# Patient Record
Sex: Female | Born: 1970 | Race: White | Hispanic: No | Marital: Married | Smoking: Former smoker
Health system: Southern US, Community
[De-identification: ages and names within clinical notes are randomized; demographics above are authoritative.]

## PROBLEM LIST (undated history)

## (undated) DIAGNOSIS — R32 Unspecified urinary incontinence: Secondary | ICD-10-CM

## (undated) DIAGNOSIS — D069 Carcinoma in situ of cervix, unspecified: Secondary | ICD-10-CM

## (undated) DIAGNOSIS — R7611 Nonspecific reaction to tuberculin skin test without active tuberculosis: Secondary | ICD-10-CM

## (undated) DIAGNOSIS — Z87440 Personal history of urinary (tract) infections: Secondary | ICD-10-CM

## (undated) DIAGNOSIS — G43909 Migraine, unspecified, not intractable, without status migrainosus: Secondary | ICD-10-CM

## (undated) DIAGNOSIS — Z8619 Personal history of other infectious and parasitic diseases: Secondary | ICD-10-CM

## (undated) DIAGNOSIS — Z789 Other specified health status: Secondary | ICD-10-CM

## (undated) DIAGNOSIS — Z8489 Family history of other specified conditions: Secondary | ICD-10-CM

## (undated) DIAGNOSIS — T7840XA Allergy, unspecified, initial encounter: Secondary | ICD-10-CM

## (undated) DIAGNOSIS — B977 Papillomavirus as the cause of diseases classified elsewhere: Secondary | ICD-10-CM

## (undated) HISTORY — PX: KNEE SURGERY: SHX244

## (undated) HISTORY — DX: Personal history of urinary (tract) infections: Z87.440

## (undated) HISTORY — DX: Personal history of other infectious and parasitic diseases: Z86.19

## (undated) HISTORY — DX: Migraine, unspecified, not intractable, without status migrainosus: G43.909

## (undated) HISTORY — DX: Carcinoma in situ of cervix, unspecified: D06.9

## (undated) HISTORY — PX: CATARACT EXTRACTION: SUR2

## (undated) HISTORY — DX: Papillomavirus as the cause of diseases classified elsewhere: B97.7

## (undated) HISTORY — PX: LEEP: SHX91

## (undated) HISTORY — PX: TONSILLECTOMY: SUR1361

## (undated) HISTORY — DX: Allergy, unspecified, initial encounter: T78.40XA

## (undated) HISTORY — DX: Nonspecific reaction to tuberculin skin test without active tuberculosis: R76.11

## (undated) HISTORY — DX: Unspecified urinary incontinence: R32

---

## 2008-03-09 ENCOUNTER — Inpatient Hospital Stay (HOSPITAL_COMMUNITY): Admission: AD | Admit: 2008-03-09 | Discharge: 2008-03-09 | Payer: Self-pay | Admitting: Obstetrics & Gynecology

## 2008-03-10 ENCOUNTER — Inpatient Hospital Stay (HOSPITAL_COMMUNITY): Admission: AD | Admit: 2008-03-10 | Discharge: 2008-03-14 | Payer: Self-pay | Admitting: Obstetrics and Gynecology

## 2008-05-14 ENCOUNTER — Emergency Department (HOSPITAL_BASED_OUTPATIENT_CLINIC_OR_DEPARTMENT_OTHER): Admission: EM | Admit: 2008-05-14 | Discharge: 2008-05-15 | Payer: Self-pay | Admitting: Emergency Medicine

## 2010-01-14 ENCOUNTER — Inpatient Hospital Stay (HOSPITAL_COMMUNITY): Admission: AD | Admit: 2010-01-14 | Discharge: 2010-01-14 | Payer: Self-pay | Admitting: Obstetrics and Gynecology

## 2010-03-02 ENCOUNTER — Inpatient Hospital Stay (HOSPITAL_COMMUNITY)
Admission: AD | Admit: 2010-03-02 | Discharge: 2010-03-04 | Payer: Self-pay | Source: Home / Self Care | Attending: Obstetrics | Admitting: Obstetrics

## 2010-03-03 ENCOUNTER — Encounter (INDEPENDENT_AMBULATORY_CARE_PROVIDER_SITE_OTHER): Payer: Self-pay | Admitting: Obstetrics

## 2010-06-05 LAB — CBC
HCT: 30.1 % — ABNORMAL LOW (ref 36.0–46.0)
Hemoglobin: 10.4 g/dL — ABNORMAL LOW (ref 12.0–15.0)
MCH: 34.4 pg — ABNORMAL HIGH (ref 26.0–34.0)
MCHC: 34.6 g/dL (ref 30.0–36.0)
MCHC: 34.8 g/dL (ref 30.0–36.0)
MCV: 100.7 fL — ABNORMAL HIGH (ref 78.0–100.0)
Platelets: 223 10*3/uL (ref 150–400)
RDW: 15.1 % (ref 11.5–15.5)
WBC: 18.7 10*3/uL — ABNORMAL HIGH (ref 4.0–10.5)

## 2010-06-05 LAB — RPR: RPR Ser Ql: NONREACTIVE

## 2010-06-06 LAB — URINALYSIS, ROUTINE W REFLEX MICROSCOPIC
Bilirubin Urine: NEGATIVE
Glucose, UA: NEGATIVE mg/dL
Hgb urine dipstick: NEGATIVE
Specific Gravity, Urine: >1.03 — ABNORMAL HIGH (ref 1.005–1.030)
Urobilinogen, UA: 0.2 mg/dL (ref 0.0–1.0)

## 2010-07-10 LAB — URINALYSIS, ROUTINE W REFLEX MICROSCOPIC
Glucose, UA: NEGATIVE mg/dL
Protein, ur: 100 mg/dL — AB
pH: 6 (ref 5.0–8.0)

## 2010-07-10 LAB — URINE MICROSCOPIC-ADD ON

## 2010-08-07 NOTE — Op Note (Signed)
NAMEMAREN, Judy Wilson               ACCOUNT NO.:  [id number]   MEDICAL RECORD NO.:  [id number]          PATIENT TYPE:  INP   LOCATION:  9112                          FACILITY:  [facility]   PHYSICIAN:  Genia Del, M.D.DATE OF BIRTH:  [birth year]   DATE OF PROCEDURE:  03/10/2008  DATE OF DISCHARGE:                               OPERATIVE REPORT   PREOPERATIVE DIAGNOSES:  18 weeks' gestation, arrest of progression in  labor.   POSTOPERATIVE DIAGNOSES:  38 weeks' gestation, arrest of progression in  labor.   PROCEDURE:  Urgent primary low-transverse C-section.   SURGEON:  Genia Del, MD   ASSISTANT:  Marlinda Mike, CNM   ANESTHESIOLOGIST:  Quillian Quince, MD   PROCEDURE:  Under epidural anesthesia, the patient was in 15-degrees  left decubitus position.  She was prepped with Betadine on the  abdominal, suprapubic, vulvar, and vaginal areas.  The bladder was  catheterized and the Foley was left in place, and the patient was draped  as usual.  The level of anesthesia was verified.  We infiltrated the  subcutaneous tissue with Marcaine one-quarter plain 10 mL at the site  where the Pfannenstiel was then done with a scalpel.  We opened the  adipose tissue with the electrocautery cutting mode, complete hemostasis  with the electrocautery cautery when necessary.  We opened the  aponeurosis transversely with the electrocautery and completed with Mayo  scissors.  We then separated the recti muscles from the aponeurosis on  the midline superiorly and inferiorly.  We opened the parietal  peritoneum longitudinally with [facility] scissors.  We then put the bladder  retractor in place.  We opened the visceral peritoneum over the lower  uterine segment transversely with Jen Mow scissors.  We reclined the  bladder downward, repositioned the bladder retractor.  We made a low-  transverse hysterotomy over the lower uterine segment with the scalpel.  We extended on each side with dressing scissors.   The fetus was in  cephalic presentation, right posterior occiput.  The amniotic fluid was  clear.  Birth of a baby girl at 10:38 p.m.  The cord was clamped and  cut, the baby was suctioned and given to the neonatal team.  Apgars were  9 and 9.  The weight was 6 pounds 3 ounces.  The placenta was evacuated  manually and sent to Labor and Delivery.  The patient is a cord blood  donor.  We did a uterine revision.  We put 40 units of Pitocin in 1 L to  help the uterine contraction.  The uterus contracts well with that.  We  then closed the hysterotomy with a first locked suture of Vicryl 0 full  plain at the level of the extension on the lower right aspect of the  incision.  We then closed the incision itself with a locked running  suture of Vicryl 0 and we made a second layer in a mattress fashion with  Vicryl 0.  Hemostasis was adequate at all levels.  The uterus was well  contracted and normal in appearance and size.  Both ovaries and both  tubes were normal in size and appearance.  We then irrigated and  suctioned the abdominopelvic cavities.  We completed hemostasis  necessary on the recti muscles and anteriorly around the bladder.  We  then closed the aponeurosis with 2 half running sutures of Vicryl 0,  complete hemostasis on the adipose tissue with the  electrocautery, reapproximated the skin with staples and a dry dressing  was applied.  The count of sponges and instrument was complete x2.  The  estimated blood loss was 500 mL.  No complications occurred, and the  patient was brought to recovery room in good stable status.      Genia Del, M.D.  Electronically Signed     ML/MEDQ  D:  03/10/2008  T:  03/11/2008  Job:  [id number]

## 2010-08-07 NOTE — Discharge Summary (Signed)
NAMEGLORIAN, MCDONELL               ACCOUNT NO.:  [id number]   MEDICAL RECORD NO.:  [id number]          PATIENT TYPE:  INP   LOCATION:  9112                          FACILITY:  [facility]   PHYSICIAN:  Genia Del, M.D.DATE OF BIRTH:  04-27-1970   DATE OF ADMISSION:  03/10/2008  DATE OF DISCHARGE:  03/14/2008                               DISCHARGE SUMMARY   ADMISSION DIAGNOSIS:  38 weeks spontaneous labor with spontaneous  rupture of membranes.   DISCHARGE DIAGNOSES:  1. 38 weeks spontaneous labor with spontaneous rupture of membranes.  2. Arrest of progression of dilatation.   PROCEDURE:  Urgent primary low transverse C-section.   HOSPITAL COURSE:  The patient was admitted in labor with spontaneous  rupture of membranes.  She progressed to 6 cm and then did not  progressed further, in spite of augmenting labor with Pitocin.  The  decision was taken to proceed with an urgent primary low transverse C-  section.  She had a baby girl.  Apgars were 9 and 9.  The estimated  blood loss was 500 mL.  No complications occurred.  The postop  evaluation was normal except for severe independent edema.  The patient  was given hydrochlorothiazide and was discharged home on postop day #4  in good stable status.  Postop hemoglobin was 9.7.  She was given postop  advice and will follow up at [facility] office in 6 weeks.      Genia Del, M.D.  Electronically Signed     ML/MEDQ  D:  05/05/2008  T:  05/06/2008  Job:  [id number]

## 2010-12-28 LAB — CCBB MATERNAL DONOR DRAW

## 2010-12-28 LAB — CBC
HCT: 34.5 % — ABNORMAL LOW (ref 36.0–46.0)
Hemoglobin: 9.7 g/dL — ABNORMAL LOW (ref 12.0–15.0)
Platelets: 231 10*3/uL (ref 150–400)
RBC: 2.81 MIL/uL — ABNORMAL LOW (ref 3.87–5.11)
RDW: 14.4 % (ref 11.5–15.5)
WBC: 16.9 10*3/uL — ABNORMAL HIGH (ref 4.0–10.5)
WBC: 19.8 10*3/uL — ABNORMAL HIGH (ref 4.0–10.5)

## 2010-12-28 LAB — RPR: RPR Ser Ql: NONREACTIVE

## 2011-02-03 ENCOUNTER — Inpatient Hospital Stay (HOSPITAL_COMMUNITY)
Admission: AD | Admit: 2011-02-03 | Discharge: 2011-02-03 | Disposition: A | Discharge status: Home / Self Care | Payer: 59 | Source: Ambulatory Visit | Attending: Obstetrics & Gynecology | Admitting: Obstetrics & Gynecology

## 2011-02-03 ENCOUNTER — Encounter (HOSPITAL_COMMUNITY): Payer: Self-pay

## 2011-02-03 DIAGNOSIS — O200 Threatened abortion: Secondary | ICD-10-CM

## 2011-02-03 HISTORY — DX: Other specified health status: Z78.9

## 2011-02-03 LAB — HCG, QUANTITATIVE, PREGNANCY: hCG, Beta Chain, Quant, S: 48 m[IU]/mL — ABNORMAL HIGH (ref <5)

## 2011-02-03 NOTE — Progress Notes (Signed)
Positive home test and blood test on 01/22/11, LMP 12/23/10 onset of vaginal bleeding x 1 hour like a period lighter now.

## 2011-02-03 NOTE — ED Provider Notes (Signed)
History   Judy Wilson is a 40 y.o. female [phone] with LMP 12/23/2010 and (+) SPT last week in office. Today presents to MAU after short episode of dark brown spotting. No active vaginal bleed at this time. Denies pelvic/abdominal pain, no N/V. She is smoker 1 pack/day. Previous term C/S, SAB, and VBAC. No hx ectopic. Blood type O(+) per office records.   Chief Complaint  Patient presents with  . Vaginal Bleeding  . Back Pain   HPI  OB History    Grav Para Term Preterm Abortions TAB SAB Ect Mult Living   4 2 2  0 1 0 1 0 0 2      Past Medical History  Diagnosis Date  . No pertinent past medical history     Past Surgical History  Procedure Date  . Cesarean section   . Knee surgery     No family history on file.  History  Substance Use Topics  . Smoking status: Current Everyday Smoker -- 1.0 packs/day  . Smokeless tobacco: Not on file  . Alcohol Use: No    Allergies:  Allergies  Allergen Reactions  . Penicillins Anaphylaxis and Rash  . Sulfa Antibiotics Anaphylaxis    Prescriptions prior to admission  Medication Sig Dispense Refill  . acetaminophen (TYLENOL) 325 MG tablet Take 650 mg by mouth every 6 (six) hours as needed. For pain       . prenatal vitamin w/FE, FA (PRENATAL 1 + 1) 27-1 MG TABS Take 1 tablet by mouth daily.          Review of Systems  Constitutional: Negative for fever and chills.  Gastrointestinal: Negative for nausea, vomiting and abdominal pain.  Musculoskeletal: Positive for back pain.  Neurological: Negative for headaches.   Physical Exam   Blood pressure 133/81, pulse 87, temperature 98.1 F (36.7 C), temperature source Oral, resp. rate 18, height 5' 4.5" (1.638 m), weight 54.432 kg (120 lb), last menstrual period 12/23/2010.  Physical Exam  Constitutional: She is oriented to person, place, and time. She appears well-developed and well-nourished. She appears distressed (appropriately teary).  HENT:  Head: Normocephalic and  atraumatic.  Neck: Normal range of motion.  Cardiovascular: Normal rate and regular rhythm.   Respiratory: Effort normal and breath sounds normal.  GI: Soft. Bowel sounds are normal. She exhibits no distension. There is no tenderness. There is no rebound.  Genitourinary:       deferred  Musculoskeletal: Normal range of motion.  Neurological: She is alert and oriented to person, place, and time.  Skin: Skin is warm and dry.  Psychiatric: She has a normal mood and affect.    MAU Course  Procedures Results for orders placed during the hospital encounter of 02/03/11 (from the past 24 hour(s))  HCG, QUANTITATIVE, PREGNANCY     Status: Abnormal   Collection Time   02/03/11  2:10 PM      Component Value Range   hCG, Beta Chain, Quant, S 48 (*) <5 (mIU/mL)   MDM   Assessment and Plan  Threatened AB with inadequate quant at [redacted]wks EGA Discussed with patient likely SAB with abnormal low quant; SAB likely starting, quant too low to visualize pregnancy via [place].  Precautions given, support given, patient appropriately upset, spouse supportive at [facility].  F/U with serial quant until <5, lab visit in office next week.   Deloyd Handy 02/03/2011, 3:51 PM

## 2011-02-03 NOTE — Progress Notes (Signed)
Pt presents to MAU with chief complaint of lower back pain and vaginal bleeding in early pregnancy  6 weeks. Pt states the lower back pain started this morning around 9:00 and vaginal bleeding started 1300. Pt is seen at [facility] and is G4P2

## 2011-02-03 NOTE — Progress Notes (Signed)
Colon Flattery notified of patient arrival to MAU with complaints vaginal bleeding and back pain. Orders received to draw Quant and will notify D.Renae Fickle CNM of results.

## 2012-05-19 ENCOUNTER — Other Ambulatory Visit: Payer: Self-pay | Admitting: Family Medicine

## 2012-05-19 ENCOUNTER — Other Ambulatory Visit (HOSPITAL_COMMUNITY)
Admission: RE | Admit: 2012-05-19 | Discharge: 2012-05-19 | Disposition: A | Discharge status: Home / Self Care | Payer: Medicaid Other | Source: Ambulatory Visit | Attending: Family Medicine | Admitting: Family Medicine

## 2012-05-19 DIAGNOSIS — Z124 Encounter for screening for malignant neoplasm of cervix: Secondary | ICD-10-CM | POA: Insufficient documentation

## 2013-08-19 ENCOUNTER — Other Ambulatory Visit: Payer: Self-pay | Admitting: Family Medicine

## 2013-08-19 ENCOUNTER — Other Ambulatory Visit (HOSPITAL_COMMUNITY)
Admission: RE | Admit: 2013-08-19 | Discharge: 2013-08-19 | Disposition: A | Discharge status: Home / Self Care | Payer: Medicaid Other | Source: Ambulatory Visit | Attending: Family Medicine | Admitting: Family Medicine

## 2013-08-19 DIAGNOSIS — Z1151 Encounter for screening for human papillomavirus (HPV): Secondary | ICD-10-CM | POA: Insufficient documentation

## 2013-08-19 DIAGNOSIS — Z124 Encounter for screening for malignant neoplasm of cervix: Secondary | ICD-10-CM | POA: Insufficient documentation

## 2013-10-12 ENCOUNTER — Encounter: Payer: Self-pay | Admitting: Interventional Cardiology

## 2013-10-12 ENCOUNTER — Ambulatory Visit (INDEPENDENT_AMBULATORY_CARE_PROVIDER_SITE_OTHER): Payer: Medicaid Other | Admitting: Interventional Cardiology

## 2013-10-12 VITALS — BP 104/64 | HR 82 | Ht 64.5 in | Wt 136.0 lb

## 2013-10-12 DIAGNOSIS — I498 Other specified cardiac arrhythmias: Secondary | ICD-10-CM

## 2013-10-12 DIAGNOSIS — I471 Supraventricular tachycardia: Secondary | ICD-10-CM | POA: Insufficient documentation

## 2013-10-12 NOTE — Progress Notes (Signed)
Patient ID: Judy RumbleKarmen A Wilson, female   DOB: 03/03/1971, 43 y.o.   MRN: [id number]   Date: 10/12/2013 ID: Judy RumbleKarmen A Barrack, DOB 12/22/1970, MRN [id number] PCP: Shirlean MylarWEBB, CAROL, D, MD  Reason: Tachycardia  ASSESSMENT;  1. Tachycardia, abrupt and resolution, likely PSVT  PLAN:  1. 30 day event monitor 2. 2-D Doppler echocardiogram 3. Clinical followup in 5-6 weeks   SUBJECTIVE: Judy Wilson is a 43 y.o. female who is  Here for evaluation of tachycardia that occurred in mid July. 3 distinct episodes. Worse of which occurred at the very onset of this complaint. It lasted approximately an hour with heart rates near 160 beats per minute. She refuses to go to the emergency room after being seen by the nurse practitioner in a [facility] in [place]. The associated symptoms or pallor, neck discomfort, chest heaviness, position of the heart was racing, and dyspnea. She subsequently had 2 additional episodes each of which lasted less than 10 minutes. They will separate days. She had 2 additional walk-in clinic visits. EKG is at walk-in clinics were all normal. She did not have an EKG during the tachycardia as [facility]. Her blood pressure was also documented along with the heart rate was above 120/70.  Additionally she has been having tenderness in the left parasternal area. Her left arm is been tingly. She has a shocklike sensation in the neck. There is stiffness in her neck. She has no history of cervical disc disease. She also feels that there is some swelling of the left hand.   Allergies  Allergen Reactions  . Penicillins Anaphylaxis and Rash  . Sulfa Antibiotics Anaphylaxis    No current outpatient prescriptions on file prior to visit.   No current facility-administered medications on file prior to visit.    Past Medical History  Diagnosis Date  . No pertinent past medical history     Past Surgical History  Procedure Laterality Date  . Cesarean section    . Knee surgery      History   Social  History  . Marital Status: Married    Spouse Name: N/A    Number of Children: N/A  . Years of Education: N/A   Occupational History  . Not on file.   Social History Main Topics  . Smoking status: Former Smoker -- 1.00 packs/day    Quit date: 03/25/2012  . Smokeless tobacco: Not on file  . Alcohol Use: No  . Drug Use: No  . Sexual Activity: Not on file   Other Topics Concern  . Not on file   Social History Narrative  . No narrative on file    Family History  Problem Relation Age of Onset  . Anemia Maternal Grandmother     ROS: No episodes of syncope. Tingling and numbness in the left arm and hand. Stiffness and soreness in the left side of the neck. Left parasternal tenderness.. Other systems negative for complaints.  OBJECTIVE: BP 104/64  Pulse 82  Ht 5' 4.5" (1.638 m)  Wt 136 lb (61.689 kg)  BMI 22.99 kg/m2,  General: No acute distress, healthy HEENT: normal no pallor or jaundice Neck: JVD flat. Carotids absent Chest: Clear Cardiac: Murmur: None. Gallop: Normal. Rhythm: Normal. Other: None Abdomen: Bruit: Absent. Pulsation: Absent Extremities: Edema: Absent. Pulses: 2+ Neuro: Normal Psych: Normal  ECG: ECG performed on 10/09/13 is normal. This [facility] was [facility]

## 2013-10-12 NOTE — Patient Instructions (Signed)
Your physician has recommended that you wear an event monitor. Event monitors are medical devices that record the heart's electrical activity. Doctors most often us these monitors to diagnose arrhythmias. Arrhythmias are problems with the speed or rhythm of the heartbeat. The monitor is a small, portable device. You can wear one while you do your normal daily activities. This is usually used to diagnose what is causing palpitations/syncope (passing out).  Your physician has requested that you have an echocardiogram. Echocardiography is a painless test that uses sound waves to create images of your heart. It provides your doctor with information about the size and shape of your heart and how well your heart's chambers and valves are working. This procedure takes approximately one hour. There are no restrictions for this procedure.  Your physician recommends that you schedule a follow-up appointment in: 6 WEEKS WITH DR. Katrinka BlazingSMITH.

## 2013-10-13 ENCOUNTER — Encounter (INDEPENDENT_AMBULATORY_CARE_PROVIDER_SITE_OTHER): Payer: Medicaid Other

## 2013-10-13 ENCOUNTER — Encounter: Payer: Self-pay | Admitting: *Deleted

## 2013-10-13 DIAGNOSIS — I471 Supraventricular tachycardia: Secondary | ICD-10-CM

## 2013-10-13 NOTE — Progress Notes (Signed)
Patient ID: Judy RumbleKarmen A Wilson, female   DOB: 05/21/1970, 43 y.o.   MRN: [id number] [employer] 30 day cardiac event monitor applied to patient.

## 2013-10-25 ENCOUNTER — Ambulatory Visit (HOSPITAL_COMMUNITY): Payer: Medicaid Other | Attending: Cardiology | Admitting: Cardiology

## 2013-10-25 DIAGNOSIS — I471 Supraventricular tachycardia, unspecified: Secondary | ICD-10-CM | POA: Insufficient documentation

## 2013-10-25 DIAGNOSIS — R0609 Other forms of dyspnea: Secondary | ICD-10-CM | POA: Diagnosis not present

## 2013-10-25 DIAGNOSIS — Z87891 Personal history of nicotine dependence: Secondary | ICD-10-CM | POA: Diagnosis not present

## 2013-10-25 DIAGNOSIS — R0989 Other specified symptoms and signs involving the circulatory and respiratory systems: Secondary | ICD-10-CM | POA: Insufficient documentation

## 2013-10-25 NOTE — Progress Notes (Signed)
Echo performed. 

## 2013-10-29 ENCOUNTER — Telehealth: Payer: Self-pay

## 2013-10-29 NOTE — Telephone Encounter (Signed)
pt given echo results.Echo is normal.pt verbalized understanding.

## 2013-10-29 NOTE — Telephone Encounter (Signed)
Message copied by Jarvis NewcomerPARRIS-GODLEY, LISA S on Fri Oct 29, 2013  2:05 PM ------      Message from: Verdis PrimeSMITH, HENRY      Created: Tue Oct 26, 2013  7:12 PM       Echo is normal. ------

## 2013-11-22 ENCOUNTER — Ambulatory Visit: Payer: Medicaid Other | Admitting: Interventional Cardiology

## 2013-12-08 ENCOUNTER — Telehealth: Payer: Self-pay

## 2013-12-08 NOTE — Telephone Encounter (Signed)
pt husbands aware of cardiac monitor results -Normal pt husband verbalized understanding.

## 2013-12-14 ENCOUNTER — Ambulatory Visit: Payer: Self-pay | Admitting: Interventional Cardiology

## 2014-01-24 ENCOUNTER — Encounter: Payer: Self-pay | Admitting: Interventional Cardiology

## 2017-12-22 ENCOUNTER — Encounter (HOSPITAL_COMMUNITY): Payer: Self-pay | Admitting: Urology

## 2017-12-22 ENCOUNTER — Ambulatory Visit: Payer: Self-pay | Admitting: General Surgery

## 2017-12-22 NOTE — H&P (Signed)
  History of Present Illness (Yarel Kilcrease MD; 12/22/2017 10:46 AM) The patient is a 47 year old female who presents for evaluation of gall stones. Referred by: Dr. Bujanowski Chief Complaint: Abdominal pain  Patient is a 47-year-old female, otherwise healthy comes in with a year and a half history of abdominal pain that was in the epigastrium. Patient states that this was but however has become more frequent recently. Secondary to continued pain, nausea and vomiting patient underwent CT scan ultrasound. His did reveal signs consistent with cholelithiasis, 2 large gallstones, as well as possible thickening of the gallbladder wall. Patient was given levofloxacin and Flagyl over the last week. She states that she did have some hives recently stopped that at the 6. Patient states that she has been on a bland diet over the last 3 days.  Patient's had no previous abdominal surgeries aside from a C-section.    Allergies (Tanisha A. Brown, RMA; 12/22/2017 10:34 AM) Penicillins  Sulfa Antibiotics  Allergies Reconciled   Medication History (Tanisha A. Brown, RMA; 12/22/2017 10:35 AM) Ondansetron (4MG Tablet, Oral) Active. levoFLOXacin (500MG Tablet, Oral) Active. methazolAMIDE (50MG Tablet, Oral) Active. Medications Reconciled    Review of Systems (Davionna Blacksher, MD; 12/22/2017 10:47 AM) General Present- Feeling well. Not Present- Fever. Respiratory Not Present- Cough and Difficulty Breathing. Cardiovascular Not Present- Chest Pain. Gastrointestinal Present- Abdominal Pain and Nausea. Musculoskeletal Not Present- Myalgia. Neurological Not Present- Weakness. All other systems negative  Vitals (Tanisha A. Brown RMA; 12/22/2017 10:33 AM) 12/22/2017 10:33 AM Weight: 146.6 lb Height: 65in Body Surface Area: 1.73 m Body Mass Index: 24.4 kg/m  Temp.: 97.8F  Pulse: 102 (Regular)  BP: 128/84 (Sitting, Left Arm, Standard)       Physical Exam (Alin Hutchins MD;  12/22/2017 10:47 AM) The physical exam findings are as follows: Note:Constitutional: No acute distress, conversant, appears stated age  Eyes: Anicteric sclerae, moist conjunctiva, no lid lag  Neck: No thyromegaly, trachea midline, no cervical lymphadenopathy  Lungs: Clear to auscultation biilaterally, normal respiratory effot  Cardiovascular: regular rate & rhythm, no murmurs, no peripheal edema, pedal pulses 2+  GI: Soft, no masses or hepatosplenomegaly, non-tender to palpation  MSK: Normal gait, no clubbing cyanosis, edema  Skin: No rashes, palpation reveals normal skin turgor  Psychiatric: Appropriate judgment and insight, oriented to person, place, and time    Assessment & Plan (Shaquasia Caponigro MD; 12/22/2017 10:47 AM) SYMPTOMATIC CHOLELITHIASIS (K80.20) Impression: 47-year-old female with symptomatic cholelithiasis, chronic cholecystitis  1. We will proceed to the operating room for a laparoscopic cholecystectomy  2. Risks and benefits were discussed with the patient to generally include, but not limited to: infection, bleeding, possible need for post op ERCP, damage to the bile ducts, bile leak, and possible need for further surgery. Alternatives were offered and described. All questions were answered and the patient voiced understanding of the procedure and wishes to proceed at this point with a laparoscopic cholecystectomy 

## 2017-12-22 NOTE — Progress Notes (Signed)
Denies chest pain, shob, or cardiology visit.  

## 2017-12-22 NOTE — H&P (View-Only) (Signed)
  History of Present Illness Axel Filler MD; 12/22/2017 10:46 AM) The patient is a 47 year old female who presents for evaluation of gall stones. Referred by: Dr. Minda Ditto Chief Complaint: Abdominal pain  Patient is a 47 year old female, otherwise healthy comes in with a year and a half history of abdominal pain that was in the epigastrium. Patient states that this was but however has become more frequent recently. Secondary to continued pain, nausea and vomiting patient underwent CT scan ultrasound. His did reveal signs consistent with cholelithiasis, 2 large gallstones, as well as possible thickening of the gallbladder wall. Patient was given levofloxacin and Flagyl over the last week. She states that she did have some hives recently stopped that at the 6. Patient states that she has been on a bland diet over the last 3 days.  Patient's had no previous abdominal surgeries aside from a C-section.    Allergies (Tanisha A. Manson Passey, RMA; 12/22/2017 10:34 AM) Penicillins  Sulfa Antibiotics  Allergies Reconciled   Medication History (Tanisha A. Manson Passey, RMA; 12/22/2017 10:35 AM) Ondansetron (4MG  Tablet, Oral) Active. levoFLOXacin (500MG  Tablet, Oral) Active. methazolAMIDE (50MG  Tablet, Oral) Active. Medications Reconciled    Review of Systems Axel Filler, MD; 12/22/2017 10:47 AM) General Present- Feeling well. Not Present- Fever. Respiratory Not Present- Cough and Difficulty Breathing. Cardiovascular Not Present- Chest Pain. Gastrointestinal Present- Abdominal Pain and Nausea. Musculoskeletal Not Present- Myalgia. Neurological Not Present- Weakness. All other systems negative  Vitals (Tanisha A. Brown RMA; 12/22/2017 10:33 AM) 12/22/2017 10:33 AM Weight: 146.6 lb Height: 65in Body Surface Area: 1.73 m Body Mass Index: 24.4 kg/m  Temp.: 97.52F  Pulse: 102 (Regular)  BP: 128/84 (Sitting, Left Arm, Standard)       Physical Exam Axel Filler MD;  12/22/2017 10:47 AM) The physical exam findings are as follows: Note:Constitutional: No acute distress, conversant, appears stated age  Eyes: Anicteric sclerae, moist conjunctiva, no lid lag  Neck: No thyromegaly, trachea midline, no cervical lymphadenopathy  Lungs: Clear to auscultation biilaterally, normal respiratory effot  Cardiovascular: regular rate & rhythm, no murmurs, no peripheal edema, pedal pulses 2+  GI: Soft, no masses or hepatosplenomegaly, non-tender to palpation  MSK: Normal gait, no clubbing cyanosis, edema  Skin: No rashes, palpation reveals normal skin turgor  Psychiatric: Appropriate judgment and insight, oriented to person, place, and time    Assessment & Plan Axel Filler MD; 12/22/2017 10:47 AM) SYMPTOMATIC CHOLELITHIASIS (K80.20) Impression: 47 year old female with symptomatic cholelithiasis, chronic cholecystitis  1. We will proceed to the operating room for a laparoscopic cholecystectomy  2. Risks and benefits were discussed with the patient to generally include, but not limited to: infection, bleeding, possible need for post op ERCP, damage to the bile ducts, bile leak, and possible need for further surgery. Alternatives were offered and described. All questions were answered and the patient voiced understanding of the procedure and wishes to proceed at this point with a laparoscopic cholecystectomy

## 2017-12-23 ENCOUNTER — Ambulatory Visit (HOSPITAL_COMMUNITY): Payer: BLUE CROSS/BLUE SHIELD | Admitting: Certified Registered Nurse Anesthetist

## 2017-12-23 ENCOUNTER — Encounter (HOSPITAL_COMMUNITY): Payer: Self-pay | Admitting: Surgery

## 2017-12-23 ENCOUNTER — Ambulatory Visit (HOSPITAL_COMMUNITY)
Admission: RE | Admit: 2017-12-23 | Discharge: 2017-12-23 | Disposition: A | Discharge status: Home / Self Care | Payer: BLUE CROSS/BLUE SHIELD | Source: Ambulatory Visit | Attending: General Surgery | Admitting: General Surgery

## 2017-12-23 ENCOUNTER — Other Ambulatory Visit: Payer: Self-pay

## 2017-12-23 ENCOUNTER — Encounter (HOSPITAL_COMMUNITY)
Admission: RE | Disposition: A | Discharge status: Home / Self Care | Payer: Self-pay | Source: Ambulatory Visit | Attending: General Surgery

## 2017-12-23 DIAGNOSIS — K8080 Other cholelithiasis without obstruction: Secondary | ICD-10-CM | POA: Diagnosis present

## 2017-12-23 DIAGNOSIS — K8010 Calculus of gallbladder with chronic cholecystitis without obstruction: Secondary | ICD-10-CM | POA: Insufficient documentation

## 2017-12-23 DIAGNOSIS — Z791 Long term (current) use of non-steroidal anti-inflammatories (NSAID): Secondary | ICD-10-CM | POA: Insufficient documentation

## 2017-12-23 DIAGNOSIS — Z87891 Personal history of nicotine dependence: Secondary | ICD-10-CM | POA: Insufficient documentation

## 2017-12-23 HISTORY — PX: CHOLECYSTECTOMY: SHX55

## 2017-12-23 HISTORY — DX: Family history of other specified conditions: Z84.89

## 2017-12-23 LAB — POCT PREGNANCY, URINE: Preg Test, Ur: NEGATIVE

## 2017-12-23 LAB — HEMOGLOBIN: HEMOGLOBIN: 13.9 g/dL (ref 12.0–15.0)

## 2017-12-23 SURGERY — LAPAROSCOPIC CHOLECYSTECTOMY
Anesthesia: General

## 2017-12-23 MED ORDER — 0.9 % SODIUM CHLORIDE (POUR BTL) OPTIME
TOPICAL | Status: DC | PRN
  Administered 2017-12-23: 1000 mL

## 2017-12-23 MED ORDER — MIDAZOLAM HCL 5 MG/5ML IJ SOLN
INTRAMUSCULAR | Status: DC | PRN
  Administered 2017-12-23: 2 mg via INTRAVENOUS

## 2017-12-23 MED ORDER — VANCOMYCIN HCL IN DEXTROSE 1-5 GM/200ML-% IV SOLN
INTRAVENOUS | Status: AC
  Filled 2017-12-23: qty 200

## 2017-12-23 MED ORDER — ONDANSETRON HCL 4 MG/2ML IJ SOLN
INTRAMUSCULAR | Status: DC | PRN
  Administered 2017-12-23: 4 mg via INTRAVENOUS

## 2017-12-23 MED ORDER — SODIUM CHLORIDE 0.9 % IR SOLN
Status: DC | PRN
  Administered 2017-12-23: 1000 mL

## 2017-12-23 MED ORDER — BUPIVACAINE HCL (PF) 0.25 % IJ SOLN
INTRAMUSCULAR | Status: AC
  Filled 2017-12-23: qty 30

## 2017-12-23 MED ORDER — OXYCODONE HCL 5 MG PO TABS
ORAL_TABLET | ORAL | Status: AC
  Filled 2017-12-23: qty 1

## 2017-12-23 MED ORDER — BUPIVACAINE HCL 0.25 % IJ SOLN
INTRAMUSCULAR | Status: DC | PRN
  Administered 2017-12-23: 30 mL

## 2017-12-23 MED ORDER — CEFAZOLIN SODIUM-DEXTROSE 2-4 GM/100ML-% IV SOLN: 2.0000 g | INTRAVENOUS | Status: DC

## 2017-12-23 MED ORDER — CELECOXIB 200 MG PO CAPS: 200.0000 mg | ORAL_CAPSULE | ORAL | Status: DC

## 2017-12-23 MED ORDER — GABAPENTIN 300 MG PO CAPS
300.0000 mg | ORAL_CAPSULE | ORAL | Status: AC
  Administered 2017-12-23: 300 mg via ORAL
  Filled 2017-12-23: qty 1

## 2017-12-23 MED ORDER — PROPOFOL 10 MG/ML IV BOLUS
INTRAVENOUS | Status: AC
  Filled 2017-12-23: qty 20

## 2017-12-23 MED ORDER — LACTATED RINGERS IV SOLN
INTRAVENOUS | Status: DC
  Administered 2017-12-23 (×2): via INTRAVENOUS

## 2017-12-23 MED ORDER — CHLORHEXIDINE GLUCONATE CLOTH 2 % EX PADS: 6.0000 | MEDICATED_PAD | Freq: Once | CUTANEOUS | Status: DC

## 2017-12-23 MED ORDER — ACETAMINOPHEN 500 MG PO TABS
1000.0000 mg | ORAL_TABLET | ORAL | Status: AC
  Administered 2017-12-23: 1000 mg via ORAL
  Filled 2017-12-23: qty 2

## 2017-12-23 MED ORDER — OXYCODONE HCL 5 MG PO TABS
5.0000 mg | ORAL_TABLET | Freq: Once | ORAL | Status: AC | PRN
  Administered 2017-12-23: 5 mg via ORAL

## 2017-12-23 MED ORDER — DEXAMETHASONE SODIUM PHOSPHATE 4 MG/ML IJ SOLN
INTRAMUSCULAR | Status: DC | PRN
  Administered 2017-12-23: 5 mg via INTRAVENOUS

## 2017-12-23 MED ORDER — ROCURONIUM BROMIDE 50 MG/5ML IV SOSY
PREFILLED_SYRINGE | INTRAVENOUS | Status: DC | PRN
  Administered 2017-12-23: 50 mg via INTRAVENOUS

## 2017-12-23 MED ORDER — FENTANYL CITRATE (PF) 100 MCG/2ML IJ SOLN
25.0000 ug | INTRAMUSCULAR | Status: DC | PRN
  Administered 2017-12-23 (×2): 50 ug via INTRAVENOUS

## 2017-12-23 MED ORDER — FENTANYL CITRATE (PF) 100 MCG/2ML IJ SOLN
INTRAMUSCULAR | Status: DC | PRN
  Administered 2017-12-23 (×4): 50 ug via INTRAVENOUS

## 2017-12-23 MED ORDER — SUGAMMADEX SODIUM 200 MG/2ML IV SOLN
INTRAVENOUS | Status: DC | PRN
  Administered 2017-12-23: 150 mg via INTRAVENOUS

## 2017-12-23 MED ORDER — FENTANYL CITRATE (PF) 100 MCG/2ML IJ SOLN
INTRAMUSCULAR | Status: AC
  Administered 2017-12-23: 50 ug via INTRAVENOUS
  Filled 2017-12-23: qty 2

## 2017-12-23 MED ORDER — ONDANSETRON HCL 4 MG/2ML IJ SOLN: 4.0000 mg | Freq: Four times a day (QID) | INTRAMUSCULAR | Status: DC | PRN

## 2017-12-23 MED ORDER — FENTANYL CITRATE (PF) 250 MCG/5ML IJ SOLN
INTRAMUSCULAR | Status: AC
  Filled 2017-12-23: qty 5

## 2017-12-23 MED ORDER — PROPOFOL 10 MG/ML IV BOLUS
INTRAVENOUS | Status: DC | PRN
  Administered 2017-12-23: 150 mg via INTRAVENOUS

## 2017-12-23 MED ORDER — VANCOMYCIN HCL 1000 MG IV SOLR
INTRAVENOUS | Status: DC | PRN
  Administered 2017-12-23: 1000 mg via INTRAVENOUS

## 2017-12-23 MED ORDER — KETOROLAC TROMETHAMINE 30 MG/ML IJ SOLN
INTRAMUSCULAR | Status: DC | PRN
  Administered 2017-12-23: 30 mg via INTRAVENOUS

## 2017-12-23 MED ORDER — MIDAZOLAM HCL 2 MG/2ML IJ SOLN
INTRAMUSCULAR | Status: AC
  Filled 2017-12-23: qty 2

## 2017-12-23 MED ORDER — LIDOCAINE 2% (20 MG/ML) 5 ML SYRINGE
INTRAMUSCULAR | Status: DC | PRN
  Administered 2017-12-23: 60 mg via INTRAVENOUS

## 2017-12-23 MED ORDER — OXYCODONE HCL 5 MG/5ML PO SOLN: 5.0000 mg | Freq: Once | ORAL | Status: AC | PRN

## 2017-12-23 MED ORDER — TRAMADOL HCL 50 MG PO TABS: 50.0000 mg | ORAL_TABLET | Freq: Four times a day (QID) | ORAL | 0 refills | Status: AC | PRN

## 2017-12-23 SURGICAL SUPPLY — 37 items
CANISTER SUCT 3000ML PPV (MISCELLANEOUS) ×3 IMPLANT
CHLORAPREP W/TINT 26ML (MISCELLANEOUS) ×3 IMPLANT
CLIP VESOLOCK MED LG 6/CT (CLIP) ×9 IMPLANT
COVER SURGICAL LIGHT HANDLE (MISCELLANEOUS) ×3 IMPLANT
COVER TRANSDUCER ULTRASND (DRAPES) ×3 IMPLANT
COVER WAND RF STERILE (DRAPES) ×3 IMPLANT
DEFOGGER SCOPE WARMER CLEARIFY (MISCELLANEOUS) ×3 IMPLANT
DERMABOND ADVANCED (GAUZE/BANDAGES/DRESSINGS) ×2
DERMABOND ADVANCED .7 DNX12 (GAUZE/BANDAGES/DRESSINGS) ×1 IMPLANT
ELECT REM PT RETURN 9FT ADLT (ELECTROSURGICAL) ×3
ELECTRODE REM PT RTRN 9FT ADLT (ELECTROSURGICAL) ×1 IMPLANT
GLOVE BIO SURGEON STRL SZ7.5 (GLOVE) ×3 IMPLANT
GOWN STRL REUS W/ TWL LRG LVL3 (GOWN DISPOSABLE) ×2 IMPLANT
GOWN STRL REUS W/ TWL XL LVL3 (GOWN DISPOSABLE) ×1 IMPLANT
GOWN STRL REUS W/TWL LRG LVL3 (GOWN DISPOSABLE) ×4
GOWN STRL REUS W/TWL XL LVL3 (GOWN DISPOSABLE) ×2
GRASPER SUT TROCAR 14GX15 (MISCELLANEOUS) ×3 IMPLANT
KIT BASIN OR (CUSTOM PROCEDURE TRAY) ×3 IMPLANT
KIT TURNOVER KIT B (KITS) ×3 IMPLANT
NEEDLE INSUFFLATION 14GA 120MM (NEEDLE) ×3 IMPLANT
NS IRRIG 1000ML POUR BTL (IV SOLUTION) ×3 IMPLANT
PAD ARMBOARD 7.5X6 YLW CONV (MISCELLANEOUS) ×3 IMPLANT
POUCH LAPAROSCOPIC INSTRUMENT (MISCELLANEOUS) ×3 IMPLANT
POUCH RETRIEVAL ECOSAC 10 (ENDOMECHANICALS) IMPLANT
POUCH RETRIEVAL ECOSAC 10MM (ENDOMECHANICALS)
SCISSORS LAP 5X35 DISP (ENDOMECHANICALS) ×3 IMPLANT
SET IRRIG TUBING LAPAROSCOPIC (IRRIGATION / IRRIGATOR) ×3 IMPLANT
SLEEVE ENDOPATH XCEL 5M (ENDOMECHANICALS) ×3 IMPLANT
SPECIMEN JAR SMALL (MISCELLANEOUS) ×3 IMPLANT
SUT MNCRL AB 4-0 PS2 18 (SUTURE) ×3 IMPLANT
TOWEL OR 17X24 6PK STRL BLUE (TOWEL DISPOSABLE) ×3 IMPLANT
TOWEL OR 17X26 10 PK STRL BLUE (TOWEL DISPOSABLE) ×3 IMPLANT
TRAY LAPAROSCOPIC MC (CUSTOM PROCEDURE TRAY) ×3 IMPLANT
TROCAR XCEL NON-BLD 11X100MML (ENDOMECHANICALS) ×3 IMPLANT
TROCAR XCEL NON-BLD 5MMX100MML (ENDOMECHANICALS) ×3 IMPLANT
TUBING INSUFFLATION (TUBING) ×3 IMPLANT
WATER STERILE IRR 1000ML POUR (IV SOLUTION) ×3 IMPLANT

## 2017-12-23 NOTE — Op Note (Signed)
12/23/2017  4:24 PM  PATIENT:  Judy Wilson  47 y.o. female  PRE-OPERATIVE DIAGNOSIS:  GALLSTONES  POST-OPERATIVE DIAGNOSIS:  CHRONIC CHOLECYSTITIS, GALLSTONES  PROCEDURE:  Procedure(s): LAPAROSCOPIC CHOLECYSTECTOMY (N/A)  SURGEON:  Surgeon(s) and Role:    Axel Filler, MD - Primary  ANESTHESIA:   local and general  EBL:  minimal   BLOOD ADMINISTERED:none  DRAINS: none   LOCAL MEDICATIONS USED:  BUPIVICAINE   SPECIMEN:  Source of Specimen:  GALLBLADDER  DISPOSITION OF SPECIMEN:  PATHOLOGY  COUNTS:  YES  TOURNIQUET:  * No tourniquets in log *  DICTATION: .Dragon Dictation  EBL: <5cc   Complications: none   Counts: reported as correct x 2   Findings:chronic inflmation of the gallbladder with gallstones  Indications for procedure: Pt is a 51 F  with RUQ pain and seen to have gallstones.   Details of the procedure: The patient was taken to the operating and placed in the supine position with bilateral SCDs in place. A time out was called and all facts were verified. A pneumoperitoneum was obtained via A Veress needle technique to a pressure of 14mm of mercury. A 5mm trochar was then placed in the right upper quadrant under visualization, and there were no injuries to any abdominal organs. A 11 mm port was then placed in the umbilical region after infiltrating with local anesthesia under direct visualization. A second epigastric port was placed under direct visualization.   The gallbladder was identified and retracted, the peritoneum was then sharply dissected from the gallbladder and this dissection was carried down to Calot's triangle. The cystic duct was identified and dissected circumferentially and seen going into the gallbladder 360.  The cystic artery was dissected away from the surrounding tissues.   The critical angle was obtained.    2 clips were placed proximally one distally and the cystic duct transected. The cystic artery was identified and 2 clips  placed proximally and one distally and transected. We then proceeded to remove the gallbladder off the hepatic fossa with Bovie cautery. A retrieval bag was then placed in the abdomen and gallbladder placed in the bag. The hepatic fossa was then reexamined and hemostasis was achieved with Bovie cautery and was excellent at this portion of the case. The subhepatic fossa and perihepatic fossa was then irrigated until the effluent was clear. The specimen bag and specimen were removed from the abdominal cavity.  The 11 mm trocar fascia was reapproximated with the Endo Close #1 Vicryl x1. The pneumoperitoneum was evacuated and all trochars removed under direct visulalization. The skin was then closed with 4-0 Monocryl and the skin dressed with Steri-Strips, gauze, and tape. The patient was awaken from general anesthesia and taken to the recovery room in stable condition.    PLAN OF CARE: Discharge to home after PACU  PATIENT DISPOSITION:  PACU - hemodynamically stable.   Delay start of Pharmacological VTE agent (>24hrs) due to surgical blood loss or risk of bleeding: not applicable

## 2017-12-23 NOTE — Interval H&P Note (Signed)
History and Physical Interval Note:  12/23/2017 2:01 PM  Judy Wilson  has presented today for surgery, with the diagnosis of GALLSTONES  The various methods of treatment have been discussed with the patient and family. After consideration of risks, benefits and other options for treatment, the patient has consented to  Procedure(s): LAPAROSCOPIC CHOLECYSTECTOMY (N/A) as a surgical intervention .  The patient's history has been reviewed, patient examined, no change in status, stable for surgery.  I have reviewed the patient's chart and labs.  Questions were answered to the patient's satisfaction.     Axel Filler

## 2017-12-23 NOTE — Discharge Instructions (Signed)
[facility] ______[facility] °LAPAROSCOPIC SURGERY: POST OP INSTRUCTIONS °Always review your discharge instruction sheet given to you by the facility where your surgery was performed. °IF YOU HAVE DISABILITY OR FAMILY LEAVE FORMS, YOU MUST BRING THEM TO THE OFFICE FOR PROCESSING.   °DO NOT GIVE THEM TO YOUR DOCTOR. ° °1. A prescription for pain medication may be given to you upon discharge.  Take your pain medication as prescribed, if needed.  If narcotic pain medicine is not needed, then you may take acetaminophen (Tylenol) or ibuprofen (Advil) as needed. °2. Take your usually prescribed medications unless otherwise directed. °3. If you need a refill on your pain medication, please contact your pharmacy.  They will contact our office to request authorization. Prescriptions will not be filled after 5pm or on week-ends. °4. You should follow a light diet the first few days after arrival home, such as soup and crackers, etc.  Be sure to include lots of fluids daily. °5. Most patients will experience some swelling and bruising in the area of the incisions.  Ice packs will help.  Swelling and bruising can take several days to resolve.  °6. It is common to experience some constipation if taking pain medication after surgery.  Increasing fluid intake and taking a stool softener (such as Colace) will usually help or prevent this problem from occurring.  A mild laxative (Milk of Magnesia or Miralax) should be taken according to package instructions if there are no bowel movements after 48 hours. °7. Unless discharge instructions indicate otherwise, you may remove your bandages 24-48 hours after surgery, and you may shower at that time.  You may have steri-strips (small skin tapes) in place directly over the incision.  These strips should be left on the skin for 7-10 days.  If your surgeon used skin glue on the incision, you may shower in 24 hours.  The glue will flake off over the next 2-3 weeks.  Any sutures or  staples will be removed at the office during your follow-up visit. °8. ACTIVITIES:  You may resume regular (light) daily activities beginning the next day--such as daily self-care, walking, climbing stairs--gradually increasing activities as tolerated.  You may have sexual intercourse when it is comfortable.  Refrain from any heavy lifting or straining until approved by your doctor. °a. You may drive when you are no longer taking prescription pain medication, you can comfortably wear a seatbelt, and you can safely maneuver your car and apply brakes. °b. RETURN TO WORK:  __________________________________________________________ °9. You should see your doctor in the office for a follow-up appointment approximately 2-3 weeks after your surgery.  Make sure that you call for this appointment within a day or two after you arrive home to insure a convenient appointment time. °10. OTHER INSTRUCTIONS: __________________________________________________________________________________________________________________________ __________________________________________________________________________________________________________________________ °WHEN TO CALL YOUR DOCTOR: °1. Fever over 101.0 °2. Inability to urinate °3. Continued bleeding from incision. °4. Increased pain, redness, or drainage from the incision. °5. Increasing abdominal pain ° °The clinic staff is available to answer your questions during regular business hours.  Please don’t hesitate to call and ask to speak to one of the nurses for clinical concerns.  If you have a medical emergency, go to the nearest emergency room or call 911.  A surgeon from [facility] is always on call at the hospital. °[address], [address], [place], [place]  [address] ? [address] [address], , [place]   [address] °[phone] ? [phone] ? FAX [phone] °Web site:   [email or web address] °

## 2017-12-23 NOTE — Anesthesia Preprocedure Evaluation (Signed)
Anesthesia Evaluation  Patient identified by MRN, date of birth, ID band Patient awake    Reviewed: Allergy & Precautions, H&P , NPO status , Patient's Chart, lab work & pertinent test results  Airway Mallampati: II   Neck ROM: full    Dental   Pulmonary former smoker,    breath sounds clear to auscultation       Cardiovascular negative cardio ROS   Rhythm:regular Rate:Normal     Neuro/Psych    GI/Hepatic gallstones   Endo/Other    Renal/GU      Musculoskeletal   Abdominal   Peds  Hematology   Anesthesia Other Findings   Reproductive/Obstetrics                             Anesthesia Physical Anesthesia Plan  ASA: II  Anesthesia Plan: General   Post-op Pain Management:    Induction: Intravenous  PONV Risk Score and Plan: 3 and Ondansetron, Dexamethasone, Midazolam and Treatment may vary due to age or medical condition  Airway Management Planned: Oral ETT  Additional Equipment:   Intra-op Plan:   Post-operative Plan: Extubation in OR  Informed Consent: I have reviewed the patients History and Physical, chart, labs and discussed the procedure including the risks, benefits and alternatives for the proposed anesthesia with the patient or authorized representative who has indicated his/her understanding and acceptance.     Plan Discussed with: CRNA, Anesthesiologist and Surgeon  Anesthesia Plan Comments:         Anesthesia Quick Evaluation

## 2017-12-23 NOTE — Transfer of Care (Signed)
Immediate Anesthesia Transfer of Care Note  Patient: Judy Wilson  Procedure(s) Performed: LAPAROSCOPIC CHOLECYSTECTOMY (N/A )  Patient Location: PACU  Anesthesia Type:General  Level of Consciousness: drowsy, patient cooperative and responds to stimulation  Airway & Oxygen Therapy: Patient Spontanous Breathing  Post-op Assessment: Report given to RN and Post -op Vital signs reviewed and stable  Post vital signs: Reviewed and stable  Last Vitals:  Vitals Value Taken Time  BP 127/76 12/23/2017  4:41 PM  Temp    Pulse 88 12/23/2017  4:44 PM  Resp 16 12/23/2017  4:44 PM  SpO2 99 % 12/23/2017  4:44 PM  Vitals shown include unvalidated device data.  Last Pain:  Vitals:   12/23/17 1641  TempSrc:   PainSc: (P) 0-No pain      Patients Stated Pain Goal: 3 (12/23/17 1349)  Complications: No apparent anesthesia complications

## 2017-12-23 NOTE — Anesthesia Procedure Notes (Signed)
Procedure Name: Intubation Date/Time: 12/23/2017 3:49 PM Performed by: Tillman Abide, CRNA Pre-anesthesia Checklist: Patient identified, Emergency Drugs available, Suction available and Patient being monitored Patient Re-evaluated:Patient Re-evaluated prior to induction Oxygen Delivery Method: Circle System Utilized Preoxygenation: Pre-oxygenation with 100% oxygen Induction Type: IV induction Ventilation: Mask ventilation without difficulty Laryngoscope Size: Miller and 2 Grade View: Grade I Tube type: Oral Tube size: 7.5 mm Number of attempts: 1 Airway Equipment and Method: Stylet and Oral airway Placement Confirmation: ETT inserted through vocal cords under direct vision,  positive ETCO2 and breath sounds checked- equal and bilateral Secured at: 22 cm Tube secured with: Tape Dental Injury: Teeth and Oropharynx as per pre-operative assessment

## 2017-12-24 ENCOUNTER — Encounter (HOSPITAL_COMMUNITY): Payer: Self-pay | Admitting: General Surgery

## 2017-12-24 NOTE — Anesthesia Postprocedure Evaluation (Signed)
Anesthesia Post Note  Patient: Judy Wilson  Procedure(s) Performed: LAPAROSCOPIC CHOLECYSTECTOMY (N/A )     Patient location during evaluation: PACU Anesthesia Type: General Level of consciousness: awake and alert Pain management: pain level controlled Vital Signs Assessment: post-procedure vital signs reviewed and stable Respiratory status: spontaneous breathing, nonlabored ventilation, respiratory function stable and patient connected to nasal cannula oxygen Cardiovascular status: blood pressure returned to baseline and stable Postop Assessment: no apparent nausea or vomiting Anesthetic complications: no    Last Vitals:  Vitals:   12/23/17 1715 12/23/17 1732  BP:  107/77  Pulse: 60 84  Resp: 10 12  Temp: (!) 36.3 C   SpO2: 98% 98%    Last Pain:  Vitals:   12/23/17 1710  TempSrc:   PainSc: 4                  Eshika Reckart S

## 2018-01-05 ENCOUNTER — Encounter (HOSPITAL_COMMUNITY): Payer: Self-pay | Admitting: Emergency Medicine

## 2018-01-05 ENCOUNTER — Other Ambulatory Visit: Payer: Self-pay

## 2018-01-05 ENCOUNTER — Emergency Department (HOSPITAL_COMMUNITY)
Admission: EM | Admit: 2018-01-05 | Discharge: 2018-01-06 | Disposition: A | Discharge status: Home / Self Care | Payer: BLUE CROSS/BLUE SHIELD | Attending: Emergency Medicine | Admitting: Emergency Medicine

## 2018-01-05 DIAGNOSIS — Z87891 Personal history of nicotine dependence: Secondary | ICD-10-CM | POA: Diagnosis not present

## 2018-01-05 DIAGNOSIS — G8918 Other acute postprocedural pain: Secondary | ICD-10-CM | POA: Diagnosis not present

## 2018-01-05 DIAGNOSIS — Z9049 Acquired absence of other specified parts of digestive tract: Secondary | ICD-10-CM | POA: Insufficient documentation

## 2018-01-05 DIAGNOSIS — L7682 Other postprocedural complications of skin and subcutaneous tissue: Secondary | ICD-10-CM

## 2018-01-05 DIAGNOSIS — R1011 Right upper quadrant pain: Secondary | ICD-10-CM

## 2018-01-05 DIAGNOSIS — R109 Unspecified abdominal pain: Secondary | ICD-10-CM | POA: Diagnosis present

## 2018-01-05 LAB — COMPREHENSIVE METABOLIC PANEL
ALK PHOS: 57 U/L (ref 38–126)
ALT: 32 U/L (ref 0–44)
ANION GAP: 6 (ref 5–15)
AST: 23 U/L (ref 15–41)
Albumin: 4.1 g/dL (ref 3.5–5.0)
BUN: 9 mg/dL (ref 6–20)
CO2: 27 mmol/L (ref 22–32)
CREATININE: 0.88 mg/dL (ref 0.44–1.00)
Calcium: 9.4 mg/dL (ref 8.9–10.3)
Chloride: 106 mmol/L (ref 98–111)
Glucose, Bld: 93 mg/dL (ref 70–99)
Potassium: 3.1 mmol/L — ABNORMAL LOW (ref 3.5–5.1)
Sodium: 139 mmol/L (ref 135–145)
Total Bilirubin: 0.5 mg/dL (ref 0.3–1.2)
Total Protein: 7.3 g/dL (ref 6.5–8.1)

## 2018-01-05 LAB — URINALYSIS, ROUTINE W REFLEX MICROSCOPIC
Bilirubin Urine: NEGATIVE
GLUCOSE, UA: NEGATIVE mg/dL
Hgb urine dipstick: NEGATIVE
KETONES UR: NEGATIVE mg/dL
LEUKOCYTES UA: NEGATIVE
Nitrite: NEGATIVE
PROTEIN: NEGATIVE mg/dL
Specific Gravity, Urine: 1.026 (ref 1.005–1.030)
pH: 5 (ref 5.0–8.0)

## 2018-01-05 LAB — CBC
HCT: 38.6 % (ref 36.0–46.0)
Hemoglobin: 12.5 g/dL (ref 12.0–15.0)
MCH: 29.5 pg (ref 26.0–34.0)
MCHC: 32.4 g/dL (ref 30.0–36.0)
MCV: 91 fL (ref 80.0–100.0)
NRBC: 0 % (ref 0.0–0.2)
Platelets: 345 10*3/uL (ref 150–400)
RBC: 4.24 MIL/uL (ref 3.87–5.11)
RDW: 12.9 % (ref 11.5–15.5)
WBC: 8.3 10*3/uL (ref 4.0–10.5)

## 2018-01-05 LAB — LIPASE, BLOOD: LIPASE: 36 U/L (ref 11–51)

## 2018-01-05 NOTE — ED Triage Notes (Signed)
Pt reports she had her gallbladder removed 13 days ago. Pt reports this am she developed severe LUQ abdominal pain radiating. Pt denies N/V, burning with urination, or urinary frequency.

## 2018-01-06 ENCOUNTER — Emergency Department (HOSPITAL_COMMUNITY): Payer: BLUE CROSS/BLUE SHIELD

## 2018-01-06 LAB — I-STAT BETA HCG BLOOD, ED (MC, WL, AP ONLY): I-stat hCG, quantitative: <5 m[IU]/mL (ref <5)

## 2018-01-06 MED ORDER — IOHEXOL 300 MG/ML  SOLN
100.0000 mL | Freq: Once | INTRAMUSCULAR | Status: AC | PRN
  Administered 2018-01-06: 100 mL via INTRAVENOUS

## 2018-01-06 MED ORDER — OXYCODONE-ACETAMINOPHEN 5-325 MG PO TABS: 2.0000 | ORAL_TABLET | Freq: Four times a day (QID) | ORAL | 0 refills | Status: DC | PRN

## 2018-01-06 MED ORDER — IOPAMIDOL (ISOVUE-300) INJECTION 61%: 100.0000 mL | Freq: Once | INTRAVENOUS | Status: DC | PRN

## 2018-01-06 MED ORDER — ONDANSETRON 4 MG PO TBDP: 4.0000 mg | ORAL_TABLET | Freq: Four times a day (QID) | ORAL | 0 refills | Status: DC | PRN

## 2018-01-06 MED ORDER — DOCUSATE SODIUM 100 MG PO CAPS: 100.0000 mg | ORAL_CAPSULE | Freq: Two times a day (BID) | ORAL | 0 refills | Status: DC

## 2018-01-06 MED ORDER — SIMETHICONE 80 MG PO CHEW: 80.0000 mg | CHEWABLE_TABLET | Freq: Four times a day (QID) | ORAL | 0 refills | Status: DC | PRN

## 2018-01-06 MED ORDER — ONDANSETRON HCL 4 MG/2ML IJ SOLN: 4.0000 mg | Freq: Four times a day (QID) | INTRAMUSCULAR | Status: DC | PRN

## 2018-01-06 MED ORDER — HYDROMORPHONE HCL 1 MG/ML IJ SOLN
1.0000 mg | Freq: Once | INTRAMUSCULAR | Status: AC
  Administered 2018-01-06: 1 mg via INTRAVENOUS
  Filled 2018-01-06: qty 1

## 2018-01-06 MED ORDER — CYCLOBENZAPRINE HCL 10 MG PO TABS: 10.0000 mg | ORAL_TABLET | Freq: Three times a day (TID) | ORAL | 0 refills | Status: DC | PRN

## 2018-01-06 MED ORDER — MORPHINE SULFATE (PF) 4 MG/ML IV SOLN
4.0000 mg | Freq: Once | INTRAVENOUS | Status: AC
  Administered 2018-01-06: 4 mg via INTRAVENOUS
  Filled 2018-01-06: qty 1

## 2018-01-06 MED ORDER — SODIUM CHLORIDE 0.9 % IV BOLUS (SEPSIS)
1000.0000 mL | Freq: Once | INTRAVENOUS | Status: AC
  Administered 2018-01-06: 1000 mL via INTRAVENOUS

## 2018-01-06 MED ORDER — ONDANSETRON HCL 4 MG/2ML IJ SOLN
4.0000 mg | Freq: Once | INTRAMUSCULAR | Status: AC
  Administered 2018-01-06: 4 mg via INTRAVENOUS
  Filled 2018-01-06: qty 2

## 2018-01-06 MED ORDER — HYDROMORPHONE HCL 1 MG/ML IJ SOLN: 1.0000 mg | INTRAMUSCULAR | Status: DC | PRN

## 2018-01-06 NOTE — ED Notes (Signed)
Pt verbalized understanding of discharge paperwork and prescriptions. Ambulatory on discharge  

## 2018-01-06 NOTE — ED Provider Notes (Addendum)
TIME SEEN: 3:32 AM  CHIEF COMPLAINT: Right-sided abdominal pain  HPI: Patient is a 47 year old female who underwent laparoscopic cholecystectomy due to chronic cholecystitis and gallstones with Dr. Derrell Lolling on 12/23/2017 who presents the emergency department with abdominal pain that started in the right mid abdomen yesterday morning and progressively worsened over the course of the day.  Has had nausea but no vomiting.  No diarrhea, bloody stool or melena.  No dysuria or hematuria.  No fever.  Only other abdominal surgery was previous C-section.  She does not know of any significant increased physical exertion but pain did start after vacuuming.   Describes it as a severe, sharp pain that is worse with movement and comes on suddenly and intermittently.  ROS: See HPI Constitutional: no fever  Eyes: no drainage  ENT: no runny nose   Cardiovascular:  no chest pain  Resp: no SOB  GI: no vomiting GU: no dysuria Integumentary: no rash  Allergy: no hives  Musculoskeletal: no leg swelling  Neurological: no slurred speech ROS otherwise negative  PAST MEDICAL HISTORY/PAST SURGICAL HISTORY:  Past Medical History:  Diagnosis Date  . Family history of adverse reaction to anesthesia    materal grandmother had trouble waking her up during bladder suspension  . No pertinent past medical history     MEDICATIONS:  Prior to Admission medications   Medication Sig Start Date End Date Taking? Authorizing Provider  naproxen sodium (ALEVE) 220 MG tablet Take 440 mg by mouth 2 (two) times daily as needed (pain).    [provider]  traMADol (ULTRAM) 50 MG tablet Take 25 mg by mouth daily as needed for moderate pain.    [provider]  traMADol (ULTRAM) 50 MG tablet Take 1 tablet (50 mg total) by mouth every 6 (six) hours as needed. 12/23/17 12/23/18  Axel Filler, MD    ALLERGIES:  Allergies  Allergen Reactions  . Penicillins Anaphylaxis and Rash    Has patient had a PCN reaction  causing immediate rash, facial/tongue/throat swelling, SOB or lightheadedness with hypotension: Yes Has patient had a PCN reaction causing severe rash involving mucus membranes or skin necrosis: No Has patient had a PCN reaction that required hospitalization: Yes Has patient had a PCN reaction occurring within the last 10 years: No If all of the above answers are "NO", then may proceed with Cephalosporin use.   . Sulfa Antibiotics Anaphylaxis  . Flagyl [Metronidazole] Hives    Took this with Levaquin developed hives; unsure which one caused hives  . Levaquin [Levofloxacin] Hives    Took this with flagyl developed hives; unsure which one caused the reaction    SOCIAL HISTORY:  Social History   Tobacco Use  . Smoking status: Former Smoker    Packs/day: 1.00    Last attempt to quit: 03/26/2011    Years since quitting: 6.7  . Smokeless tobacco: Never Used  Substance Use Topics  . Alcohol use: No    FAMILY HISTORY: Family History  Problem Relation Age of Onset  . Anemia Maternal Grandmother     EXAM: BP 108/74 (BP Location: Right Arm)   Pulse 68   Temp 97.6 F (36.4 C) (Oral)   Resp 18   SpO2 99%  CONSTITUTIONAL: Alert and oriented and responds appropriately to questions. Well-appearing; well-nourished HEAD: Normocephalic EYES: Conjunctivae clear, pupils appear equal, EOMI ENT: normal nose; moist mucous membranes NECK: Supple, no meningismus, no nuchal rigidity, no LAD  CARD: RRR; S1 and S2 appreciated; no murmurs, no clicks, no  rubs, no gallops RESP: Normal chest excursion without splinting or tachypnea; breath sounds clear and equal bilaterally; no wheezes, no rhonchi, no rales, no hypoxia or respiratory distress, speaking full sentences ABD/GI: Normal bowel sounds; non-distended; soft, tender in the right mid abdomen, surgical incision sites appear to be healing well without surrounding redness or warmth, there is small areas of ecchymosis around her incisions, no drainage,  no rebound, no guarding, no peritoneal signs, no hepatosplenomegaly BACK:  The back appears normal and is non-tender to palpation, there is no CVA tenderness EXT: Normal ROM in all joints; non-tender to palpation; no edema; normal capillary refill; no cyanosis, no calf tenderness or swelling    SKIN: Normal color for age and race; warm; no rash NEURO: Moves all extremities equally PSYCH: The patient's mood and manner are appropriate. Grooming and personal hygiene are appropriate.  MEDICAL DECISION MAKING: Patient here with right-sided abdominal pain after laparoscopic cholecystectomy.  Pain started yesterday morning.  No fever.  Incisions look like they are healing well without signs of cellulitis and no bleeding or drainage.  Will obtain CT scan for further evaluation.  Will give pain and nausea medicine.  Differential includes abscess, bowel obstruction, colitis, pancreatitis.  ED PROGRESS: Patient CT scan shows no acute abnormality.  Her bile ducts appear normal.  Nothing at this time to suggest choledocholithiasis given normal LFTs and lipase.  No signs of cholangitis.  She is afebrile with no leukocytosis.  She does report that she was vacuuming before the symptoms started.  This could be pain from tearing of scar tissue from increased exertion recently.  She feels much better in the ED.  I feel she is safe to be discharged with close outpatient follow-up with her general surgeon.  We discussed at length return precautions.  States she has tramadol at home but will discharge with prescription of Percocet, Colace, Zofran, simethicone for symptomatic relief.  Patient and husband comfortable with this plan.   6:10 AM  Pt now having increasing pain and is very upset.  She now does not feel comfortable with plan to be discharged home.  Will discuss with general surgery to consult on patient in the morning.  Dispo per general surgery.  If they feel she is well enough to go home, I have sent prescriptions  of Colace, Percocet, Zofran, simethicone to her pharmacy.  She is comfortable with this plan.  6:35 AM  D/w Dr. Luisa Hart with general surgery.  He will have the morning team round on patient in the morning for further recommendations.  It appears Dr. Derrell Lolling, her surgeon, is on call this morning.  7:38 AM  Pt has been seen by Dr. Derrell Lolling.  Appreciate his help.  He agrees that this is likely abdominal wall pain and recommend sending her home with prescription of Flexeril.  Patient comfortable with plan for discharge.  Return precautions have been discussed.   At this time, I do not feel there is any life-threatening condition present. I have reviewed and discussed all results (EKG, imaging, lab, urine as appropriate) and exam findings with patient/family. I have reviewed nursing notes and appropriate previous records.  I feel the patient is safe to be discharged home without further emergent workup and can continue workup as an outpatient as needed. Discussed usual and customary return precautions. Patient/family verbalize understanding and are comfortable with this plan.  Outpatient follow-up has been provided if needed. All questions have been answered.    Ward, Layla Maw, DO 01/06/18 [phone]

## 2018-01-06 NOTE — Discharge Instructions (Addendum)
Do not take Percocet and tramadol together at the same time.    Your labs, urine, CT scan today were normal.  Your pain is likely secondary to incisional site pain, muscle spasms in your abdominal wall.  Please do not lift anything over 5 pounds and limit your physical exertion over the next 2 weeks.  Please follow-up with your surgeon if symptoms continue.  Please return to the emergency department if your pain increases or becomes more severe, constant.  Please return to the ER if you have fever of 101 or higher, vomiting he cannot stop, blood in your stool.

## 2018-01-06 NOTE — ED Notes (Signed)
Took patient saline lock out patient is now getting ready for discharge

## 2018-01-06 NOTE — Progress Notes (Signed)
Patient ID: Judy Wilson, female   DOB: 1970/04/06, 47 y.o.   MRN: [id number] Pt with some r sided periumbilical pain. Pt with normal labs and CT Appears to be muscular in nature. Would rec home with muscle relaxer to help with pain. Pt to f/u with me as scheduled.

## 2019-05-20 IMAGING — CT CT ABD-PELV W/ CM
2 of 5 series · 16 of 46 positions shown, 18 images · IV contrast (omnipaque)
Comparison: None.

CLINICAL DATA: Severe left upper quadrant pain. Recent
cholecystectomy.

EXAM:
CT ABDOMEN AND PELVIS WITH CONTRAST
TECHNIQUE: Multidetector CT imaging of the abdomen and pelvis was performed
using the standard protocol following bolus administration of
intravenous contrast.
CONTRAST:  100mL OMNIPAQUE IOHEXOL 300 MG/ML  SOLN

[Series 3: a/p w/ 5mm · axial · 0.86mm/px · z∈[+841,+1226]mm · 13 of 87 slices shown, 15 images]
[im 5/87  soft-tissue]
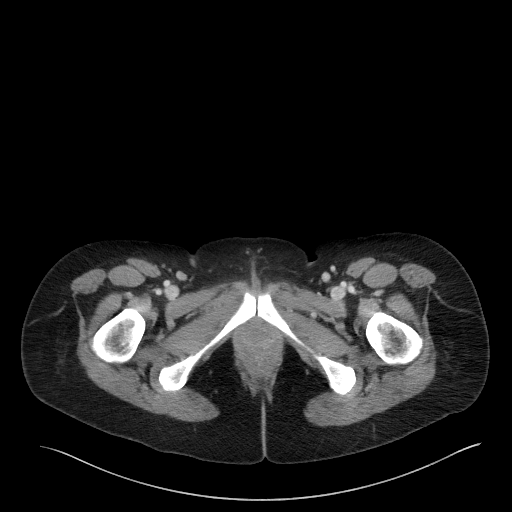
[im 5/87  bone]
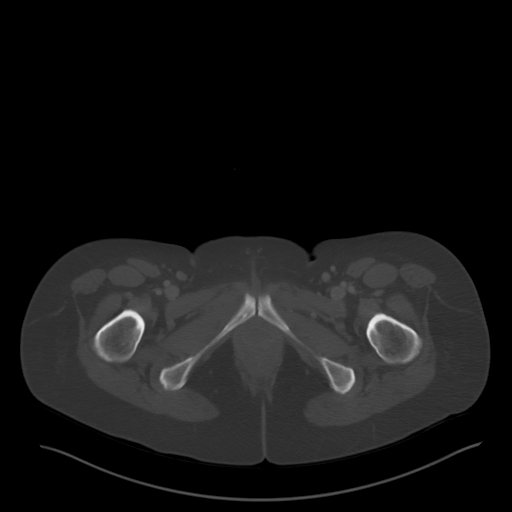
[im 13/87  soft-tissue]
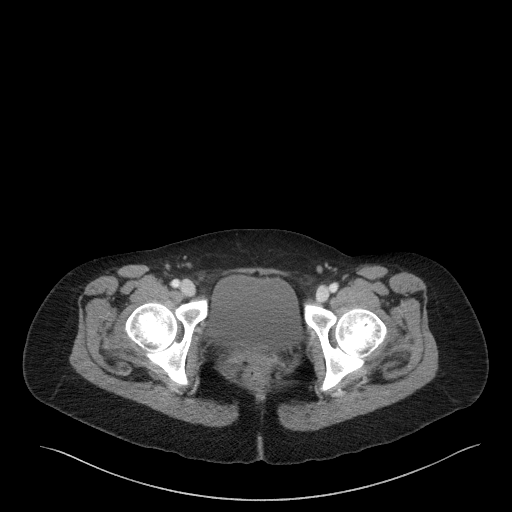
[im 18/87  soft-tissue]
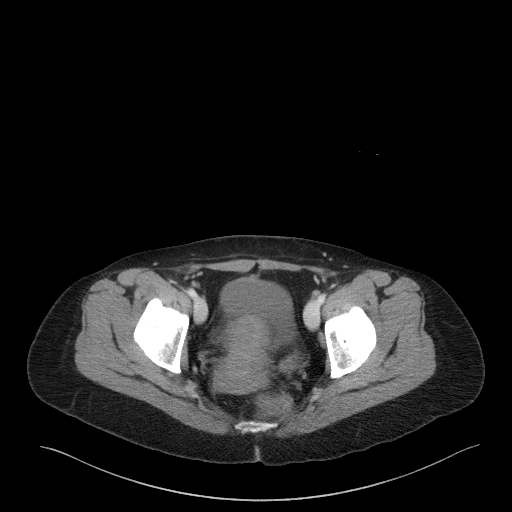
[im 26/87  soft-tissue]
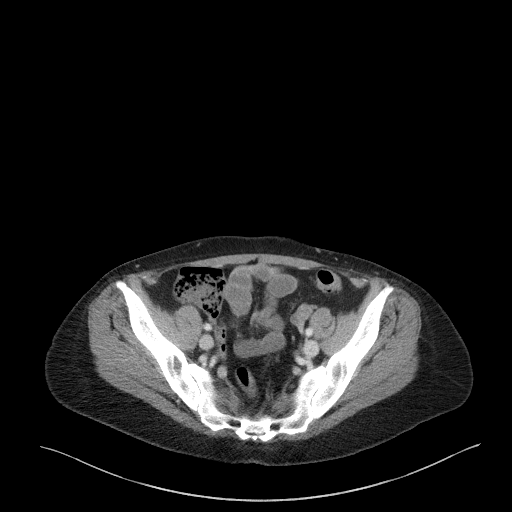
[im 31/87  soft-tissue]
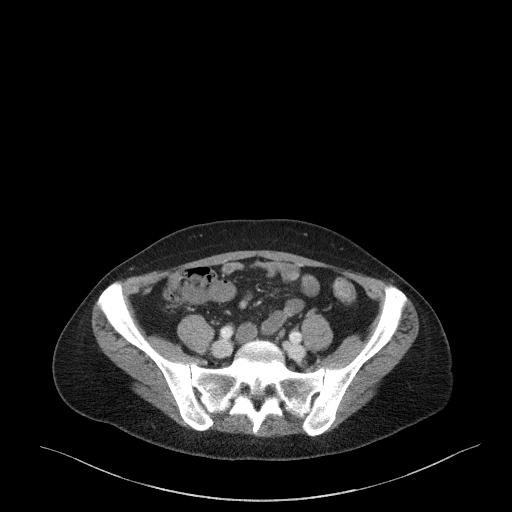
[im 39/87  soft-tissue]
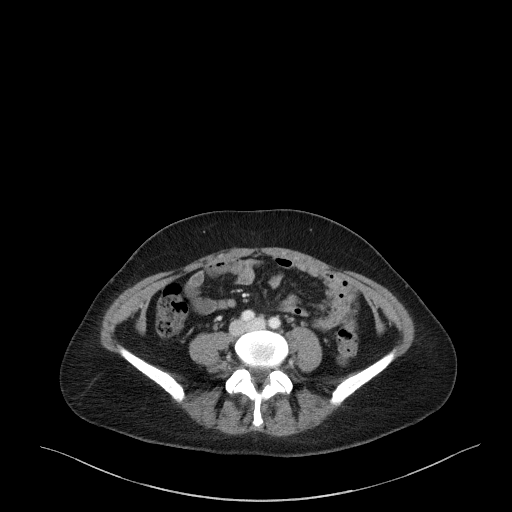
[im 44/87  soft-tissue]
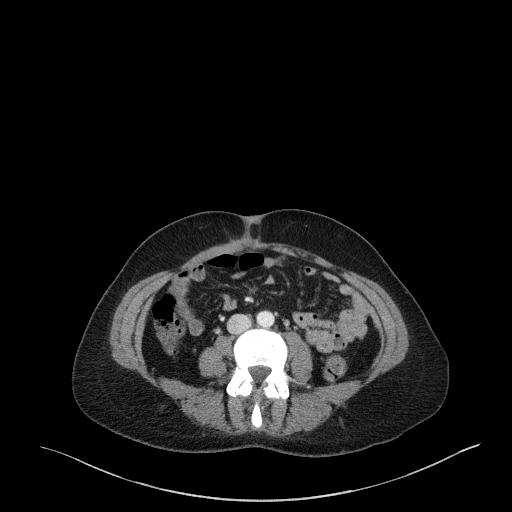
[im 48/87  soft-tissue]
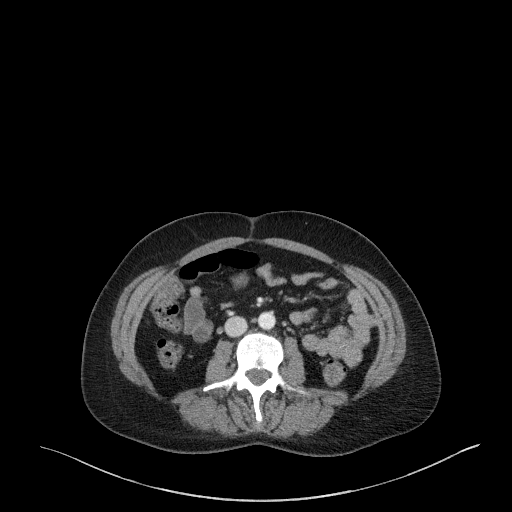
[im 56/87  soft-tissue]
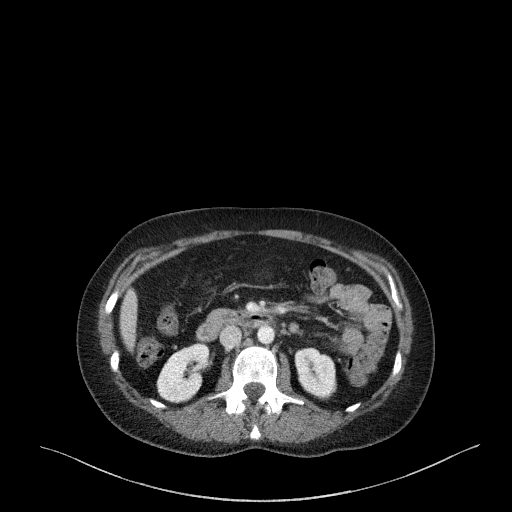
[im 56/87  bone]
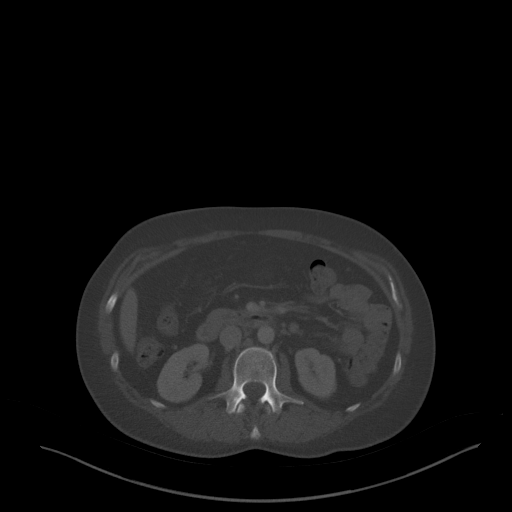
[im 61/87  soft-tissue]
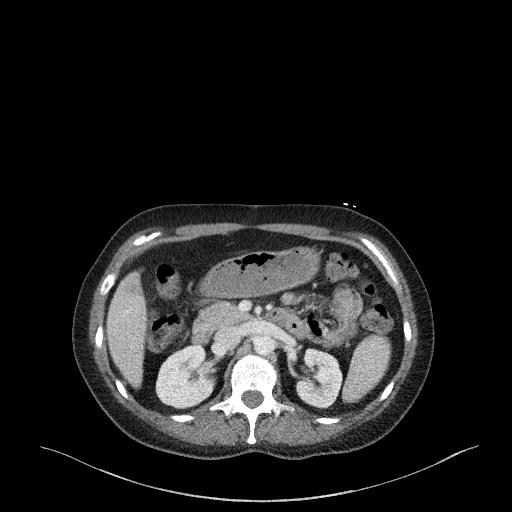
[im 69/87  soft-tissue]
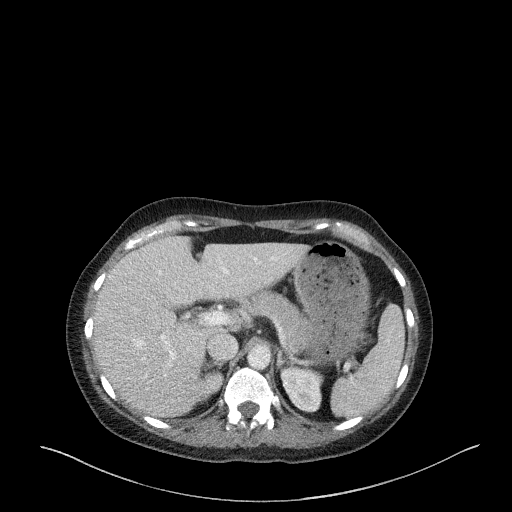
[im 74/87  soft-tissue]
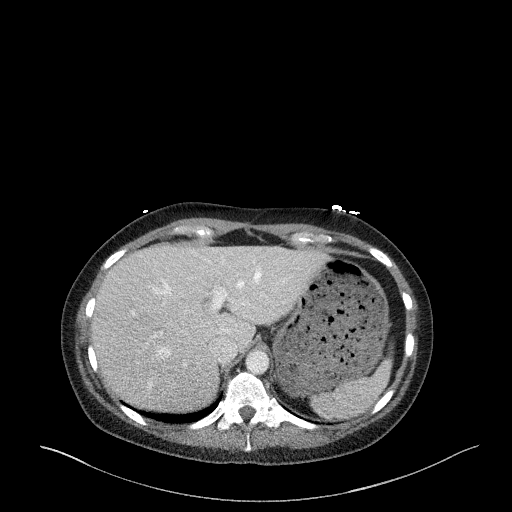
[im 82/87  soft-tissue]
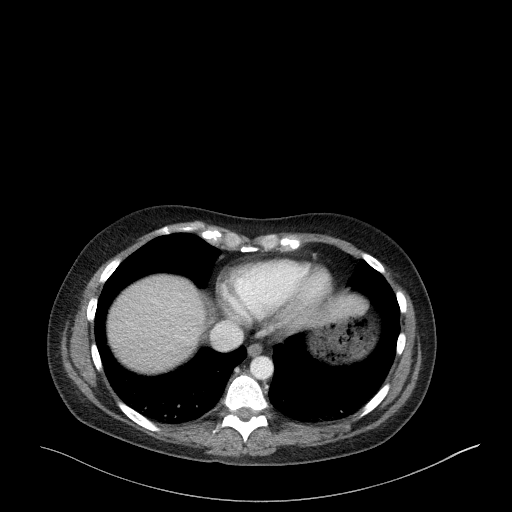

[Series 6: a/p w/ cor · coronal · 0.64mm/px · 3 of 110 slices shown]
[im 37/110  soft-tissue]
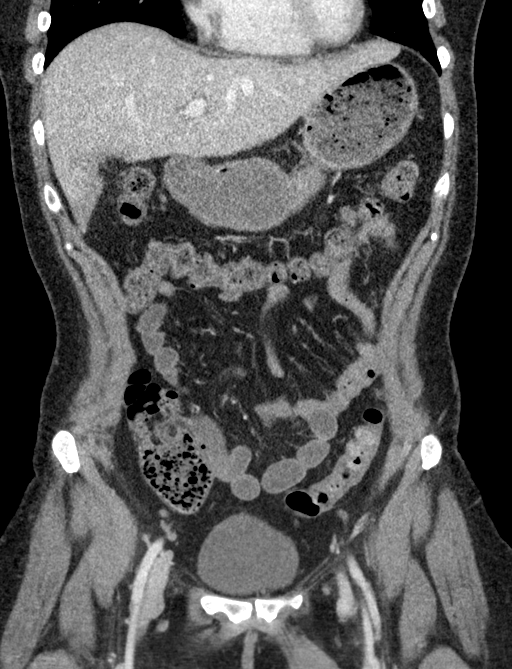
[im 49/110  soft-tissue]
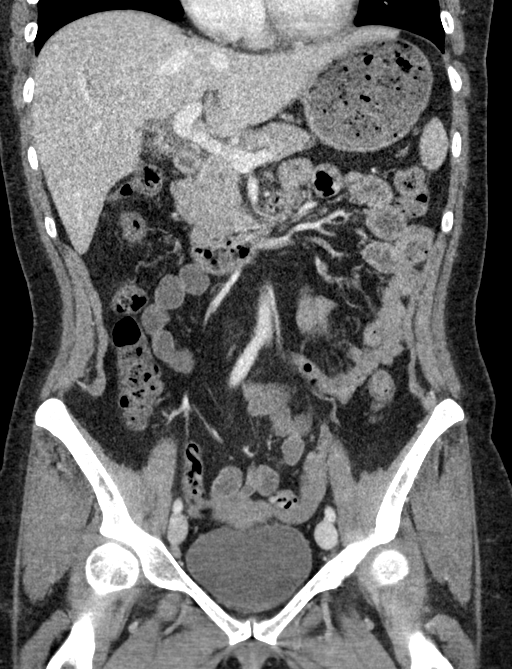
[im 61/110  soft-tissue]
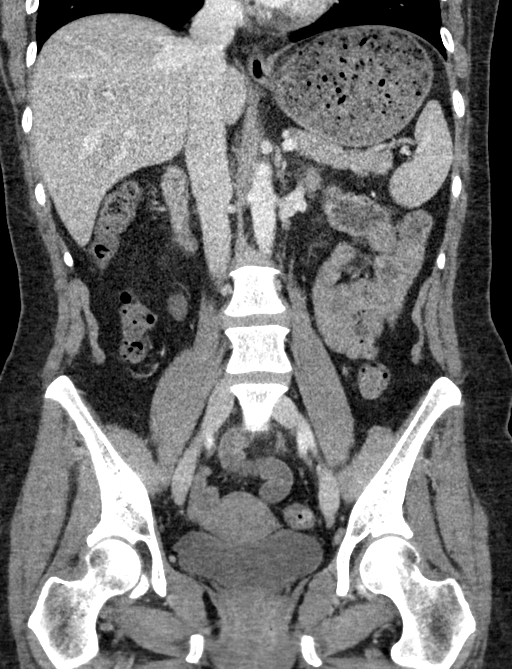

[16 of 46 positions shown; findings below may reference images not displayed]

FINDINGS: Lower chest: Atelectasis in the lung bases.

Hepatobiliary: Mild diffuse fatty infiltration of the liver. No
focal lesions. Surgical absence of the gallbladder. Small amount of
fluid and gas in the gallbladder fossa, likely postoperative. No
bile duct dilatation.

Pancreas: Unremarkable. No pancreatic ductal dilatation or
surrounding inflammatory changes.

Spleen: Normal in size without focal abnormality.

Adrenals/Urinary Tract: Adrenal glands are unremarkable. Kidneys are
normal, without renal calculi, focal lesion, or hydronephrosis.
Bladder is unremarkable.

Stomach/Bowel: Stomach is within normal limits. Appendix appears
normal. No evidence of bowel wall thickening, distention, or
inflammatory changes.

Vascular/Lymphatic: No significant vascular findings are present. No
enlarged abdominal or pelvic lymph nodes.

Reproductive: Uterus and bilateral adnexa are unremarkable.

Other: No free air or free fluid. Small periumbilical hernia
containing fat.

Musculoskeletal: No acute or significant osseous findings.
IMPRESSION: 1. No acute process demonstrated in the abdomen or pelvis. No
evidence of bowel obstruction or inflammation. Normal appendix.
2. Mild diffuse fatty infiltration of the liver.
3. Surgical absence of the gallbladder with small amount of fluid
and gas in the gallbladder fossa, likely postoperative. No bile duct
dilatation.
4. Atelectasis in the lung bases.

## 2022-05-02 DIAGNOSIS — R300 Dysuria: Secondary | ICD-10-CM | POA: Diagnosis not present

## 2022-09-18 ENCOUNTER — Ambulatory Visit: Payer: BLUE CROSS/BLUE SHIELD | Admitting: Family Medicine

## 2022-10-01 ENCOUNTER — Encounter: Payer: Self-pay | Admitting: Family Medicine

## 2022-10-01 ENCOUNTER — Ambulatory Visit: Payer: BC Managed Care – PPO | Admitting: Family Medicine

## 2022-10-01 ENCOUNTER — Ambulatory Visit (INDEPENDENT_AMBULATORY_CARE_PROVIDER_SITE_OTHER): Payer: BC Managed Care – PPO

## 2022-10-01 VITALS — BP 100/64 | HR 72 | Temp 98.4°F | Ht 65.5 in | Wt 152.2 lb

## 2022-10-01 DIAGNOSIS — M25552 Pain in left hip: Secondary | ICD-10-CM

## 2022-10-01 DIAGNOSIS — G8929 Other chronic pain: Secondary | ICD-10-CM

## 2022-10-01 DIAGNOSIS — N393 Stress incontinence (female) (male): Secondary | ICD-10-CM | POA: Insufficient documentation

## 2022-10-01 DIAGNOSIS — Z1231 Encounter for screening mammogram for malignant neoplasm of breast: Secondary | ICD-10-CM

## 2022-10-01 DIAGNOSIS — Z1211 Encounter for screening for malignant neoplasm of colon: Secondary | ICD-10-CM

## 2022-10-01 NOTE — Assessment & Plan Note (Signed)
I will start work up by ordering x-rays of the left hip to look for any bone pathology. She has been taking NSAIDS at home OTC, I recommended she continue to do this and once the films are back we can formulate a plan.

## 2022-10-01 NOTE — Assessment & Plan Note (Signed)
Severe pelvic floor dysfunction. We discussed methods for treatment, home Kegel exercises were not effective, I advised that the patient start pelvic floor physical therapy if she wants to try and avoid surgery. Patient is agreeable to the referral for pelvic floor therapy.

## 2022-10-01 NOTE — Progress Notes (Signed)
New Patient Office Visit  Subjective    Patient ID: Judy Wilson, female    DOB: 14-Dec-1970  Age: 52 y.o. MRN: [id number]  CC:  Chief Complaint  Patient presents with   Establish Care    HPI Judy Wilson presents to establish care Pt reports she didn't really have a previous PCP, is not seeing a GYN regularly. States that she just got insurance and needs to get caught up. States she she needs a new pap smear -- states that she had CIN 3 about 24 years ago, states that she had a negative HPV test about 8 years ago, is back on every 3 years screening, has not had an abnormal pap smear since the original abnormal pap 24 years.   Pt reports she is not stating skipping her periods occasionally, states that she is having sweats/hot flashes, is also having hip/joint pain on both sides. Was concerned that it was related to menopause? States states she has tenderness to palpation of the crest of her left hip, states that it sometimes wakes her up at night. States that she feels like the pain is in the bone itself. Has been going on for about 6 months now. She does sleep on her left side and did change her mattress so it has gotten a little bit better.   Patient is also reporting increase in urinary incontinence. States that it is when she sneezes or coughs, jumps, walks, etc. States that it was very bad after she had her last daughter. States that the women in your family all had bladder prolapse. States that she is a little nervous about getting a surgery, worried about complications with the mesh. Has tried home exercises   No current outpatient medications  Past Medical History:  Diagnosis Date   Allergies    CIN III (cervical intraepithelial neoplasia III)    Family history of adverse reaction to anesthesia    materal grandmother had trouble waking her up during bladder suspension   History of chickenpox    History of UTI    HPV in female    Incontinence of urine    Migraines    No  pertinent past medical history    Positive PPD     Past Surgical History:  Procedure Laterality Date   CATARACT EXTRACTION Right    CESAREAN SECTION     CHOLECYSTECTOMY N/A 12/23/2017   Procedure: LAPAROSCOPIC CHOLECYSTECTOMY;  Surgeon: Axel Filler, MD;  Location: [facility] OR;  Service: General;  Laterality: N/A;   KNEE SURGERY     LEEP     TONSILLECTOMY     VAGINAL BIRTH AFTER CESAREAN SECTION      Family History  Problem Relation Age of Onset   Asthma Mother    Arthritis Mother    COPD Father    Hypertension Brother    Hyperlipidemia Brother    Heart disease Brother    Heart attack Brother    Heart disease Maternal Grandmother    Arthritis Maternal Grandmother    Anemia Maternal Grandmother    Depression Maternal Grandfather    Heart disease Paternal Grandmother    Drug abuse Paternal Grandfather     Social History   Socioeconomic History   Marital status: Married    Spouse name: Not on file   Number of children: Not on file   Years of education: Not on file   Highest education level: Not on file  Occupational History   Not on file  Tobacco  Use   Smoking status: Former    Packs/day: 1    Types: Cigarettes    Quit date: 03/26/2011    Years since quitting: 11.5   Smokeless tobacco: Never  Vaping Use   Vaping Use: Former  Substance and Sexual Activity   Alcohol use: Yes   Drug use: No   Sexual activity: Yes  Other Topics Concern   Not on file  Social History Narrative   Not on file   Social Determinants of Health   Financial Resource Strain: Not on file  Food Insecurity: Not on file  Transportation Needs: Not on file  Physical Activity: Not on file  Stress: Not on file  Social Connections: Not on file  Intimate Partner Violence: Not on file    Review of Systems  All other systems reviewed and are negative.       Objective    BP 100/64 (BP Location: Left Arm, Patient Position: Sitting, Cuff Size: Normal)   Pulse 72   Temp 98.4 F (36.9 C)  (Oral)   Ht 5' 5.5" (1.664 m)   Wt 152 lb 3.2 oz (69 kg)   LMP 09/12/2022 (Exact Date)   SpO2 98%   BMI 24.94 kg/m   Physical Exam Vitals reviewed.  Constitutional:      Appearance: Normal appearance. She is well-groomed and normal weight.  Eyes:     Conjunctiva/sclera: Conjunctivae normal.  Neck:     Thyroid: No thyromegaly.  Cardiovascular:     Rate and Rhythm: Normal rate and regular rhythm.     Pulses: Normal pulses.     Heart sounds: S1 normal and S2 normal.  Pulmonary:     Effort: Pulmonary effort is normal.     Breath sounds: Normal breath sounds and air entry.  Abdominal:     General: Bowel sounds are normal.  Musculoskeletal:     Right lower leg: No edema.     Left lower leg: No edema.  Neurological:     Mental Status: She is alert and oriented to person, place, and time. Mental status is at baseline.     Gait: Gait is intact.  Psychiatric:        Mood and Affect: Mood and affect normal.        Speech: Speech normal.        Behavior: Behavior normal.        Judgment: Judgment normal.         Assessment & Plan:  Breast cancer screening by mammogram -     3D Screening Mammogram, Left and Right; Future  Colon cancer screening -     Cologuard  Chronic left hip pain Assessment & Plan: I will start work up by ordering x-rays of the left hip to look for any bone pathology. She has been taking NSAIDS at home OTC, I recommended she continue to do this and once the films are back we can formulate a plan.   Orders: -     DG HIP UNILAT W OR W/O PELVIS 2-3 VIEWS LEFT; Future  Stress incontinence Assessment & Plan: Severe pelvic floor dysfunction. We discussed methods for treatment, home Kegel exercises were not effective, I advised that the patient start pelvic floor physical therapy if she wants to try and avoid surgery. Patient is agreeable to the referral for pelvic floor therapy.   Orders: -     Ambulatory referral to Physical Therapy    Return for  annual physical with pap smear -- ok to schedule  any time.   Karie Georges, MD

## 2022-10-21 ENCOUNTER — Encounter: Payer: Self-pay | Admitting: Family Medicine

## 2022-10-21 ENCOUNTER — Other Ambulatory Visit (HOSPITAL_COMMUNITY)
Admission: RE | Admit: 2022-10-21 | Discharge: 2022-10-21 | Disposition: A | Discharge status: Home / Self Care | Payer: BC Managed Care – PPO | Source: Ambulatory Visit | Attending: Family Medicine | Admitting: Family Medicine

## 2022-10-21 ENCOUNTER — Ambulatory Visit (INDEPENDENT_AMBULATORY_CARE_PROVIDER_SITE_OTHER): Payer: BC Managed Care – PPO | Admitting: Family Medicine

## 2022-10-21 VITALS — BP 100/58 | HR 77 | Temp 98.5°F | Ht 65.0 in | Wt 152.1 lb

## 2022-10-21 DIAGNOSIS — Z124 Encounter for screening for malignant neoplasm of cervix: Secondary | ICD-10-CM | POA: Insufficient documentation

## 2022-10-21 DIAGNOSIS — N3946 Mixed incontinence: Secondary | ICD-10-CM

## 2022-10-21 DIAGNOSIS — Z13220 Encounter for screening for lipoid disorders: Secondary | ICD-10-CM

## 2022-10-21 DIAGNOSIS — Z0000 Encounter for general adult medical examination without abnormal findings: Secondary | ICD-10-CM | POA: Diagnosis not present

## 2022-10-21 LAB — COMPREHENSIVE METABOLIC PANEL
ALT: 13 U/L (ref 0–35)
AST: 15 U/L (ref 0–37)
Albumin: 4.5 g/dL (ref 3.5–5.2)
Alkaline Phosphatase: 74 U/L (ref 39–117)
BUN: 14 mg/dL (ref 6–23)
CO2: 28 mEq/L (ref 19–32)
Calcium: 9.7 mg/dL (ref 8.4–10.5)
Chloride: 101 mEq/L (ref 96–112)
Creatinine, Ser: 0.83 mg/dL (ref 0.40–1.20)
GFR: 81.54 mL/min (ref >60.00)
Glucose, Bld: 73 mg/dL (ref 70–99)
Potassium: 3.9 mEq/L (ref 3.5–5.1)
Sodium: 137 mEq/L (ref 135–145)
Total Bilirubin: 0.7 mg/dL (ref 0.2–1.2)
Total Protein: 7.7 g/dL (ref 6.0–8.3)

## 2022-10-21 LAB — CBC WITH DIFFERENTIAL/PLATELET
Basophils Absolute: 0 10*3/uL (ref 0.0–0.1)
Basophils Relative: 0.6 % (ref 0.0–3.0)
Eosinophils Absolute: 0.1 10*3/uL (ref 0.0–0.7)
Eosinophils Relative: 1.5 % (ref 0.0–5.0)
HCT: 41 % (ref 36.0–46.0)
Hemoglobin: 14 g/dL (ref 12.0–15.0)
Lymphocytes Relative: 24.7 % (ref 12.0–46.0)
Lymphs Abs: 1.8 10*3/uL (ref 0.7–4.0)
MCHC: 34.1 g/dL (ref 30.0–36.0)
MCV: 90.3 fl (ref 78.0–100.0)
Monocytes Absolute: 0.8 10*3/uL (ref 0.1–1.0)
Monocytes Relative: 10.1 % (ref 3.0–12.0)
Neutro Abs: 4.7 10*3/uL (ref 1.4–7.7)
Neutrophils Relative %: 63.1 % (ref 43.0–77.0)
Platelets: 291 10*3/uL (ref 150.0–400.0)
RBC: 4.54 Mil/uL (ref 3.87–5.11)
RDW: 13.6 % (ref 11.5–15.5)
WBC: 7.5 10*3/uL (ref 4.0–10.5)

## 2022-10-21 LAB — LIPID PANEL
Cholesterol: 171 mg/dL (ref 0–200)
HDL: 49.4 mg/dL (ref >39.00)
LDL Cholesterol: 102 mg/dL — ABNORMAL HIGH (ref 0–99)
NonHDL: 121.64
Total CHOL/HDL Ratio: 3
Triglycerides: 99 mg/dL (ref 0.0–149.0)
VLDL: 19.8 mg/dL (ref 0.0–40.0)

## 2022-10-21 NOTE — Progress Notes (Signed)
Complete physical exam  Patient: Judy Wilson   DOB: 1970-07-29   52 y.o. Female  MRN: [id number]  Subjective:    Chief Complaint  Patient presents with   Annual Exam    Judy Wilson is a 52 y.o. female who presents today for a complete physical exam. She reports consuming a general diet. The patient does not participate in regular exercise at present. She generally feels well. She reports sleeping sleeps ok but is a light sleeper, wakes up at night. She does not have additional problems to discuss today.    Most recent fall risk assessment:     No data to display           Most recent depression screenings:    10/21/2022    1:40 PM 10/01/2022   10:34 AM  PHQ 2/9 Scores  PHQ - 2 Score 0 0    Vision:patient has cataract surgery and her vision is now normal and Dental: No current dental problems and Receives regular dental care  Patient Active Problem List   Diagnosis Date Noted   Chronic left hip pain 10/01/2022   Stress incontinence 10/01/2022   SVT (supraventricular tachycardia) 10/12/2013      Patient Care Team: Karie Georges, MD as PCP - General (Family Medicine)   No outpatient medications prior to visit.   No facility-administered medications prior to visit.    Review of Systems  HENT:  Negative for hearing loss.   Eyes:  Negative for blurred vision.  Respiratory:  Negative for shortness of breath.   Cardiovascular:  Negative for chest pain.  Gastrointestinal: Negative.   Genitourinary: Negative.   Musculoskeletal:  Negative for back pain.  Neurological:  Negative for headaches.  Psychiatric/Behavioral:  Negative for depression.   All other systems reviewed and are negative.      Objective:     BP (!) 100/58 (BP Location: Left Arm, Patient Position: Sitting, Cuff Size: Normal)   Pulse 77   Temp 98.5 F (36.9 C) (Oral)   Ht 5\' 5"  (1.651 m)   Wt 152 lb 1.6 oz (69 kg)   LMP 10/11/2022 (Exact Date)   SpO2 99%   BMI 25.31 kg/m     Physical Exam Vitals reviewed. Exam conducted with a chaperone present.  Constitutional:      Appearance: She is normal weight.  Cardiovascular:     Rate and Rhythm: Normal rate and regular rhythm.     Pulses: Normal pulses.     Heart sounds: Normal heart sounds.  Pulmonary:     Effort: Pulmonary effort is normal.     Breath sounds: Normal breath sounds.  Chest:     Chest wall: No mass.  Breasts:    Tanner Score is 5.     Right: Normal. No mass or tenderness.     Left: Normal. No mass or tenderness.  Abdominal:     General: Abdomen is flat. Bowel sounds are normal.     Palpations: Abdomen is soft.  Genitourinary:    General: Normal vulva.     Exam position: Lithotomy position.     Tanner stage (genital): 5.     Vagina: Normal.     Cervix: Normal.     Uterus: Normal.      Adnexa: Right adnexa normal and left adnexa normal.     Rectum: Normal.  Lymphadenopathy:     Upper Body:     Right upper body: No axillary adenopathy.     Left upper  body: No axillary adenopathy.  Neurological:     General: No focal deficit present.     Mental Status: She is alert and oriented to person, place, and time.  Psychiatric:        Mood and Affect: Mood and affect normal.     No results found for any visits on 10/21/22.     Assessment & Plan:    Routine Health Maintenance and Physical Exam  Immunization History  Administered Date(s) Administered   PFIZER(Purple Top)SARS-COV-2 Vaccination 06/16/2019, 07/17/2019, 03/06/2020, 01/17/2021   Tdap 09/30/2017    Health Maintenance  Topic Date Due   Colonoscopy  Never done   PAP SMEAR-Modifier  09/30/2020   MAMMOGRAM  03/29/2021   Zoster Vaccines- Shingrix (1 of 2) Never done   COVID-19 Vaccine (5 - 2023-24 season) 11/06/2022 (Originally 11/23/2021)   Hepatitis C Screening  10/01/2023 (Originally 03/29/1989)   HIV Screening  10/01/2023 (Originally 03/29/1986)   INFLUENZA VACCINE  10/24/2022   DTaP/Tdap/Td (2 - Td or Tdap) 10/01/2027    HPV VACCINES  Aged Out    Discussed health benefits of physical activity, and encouraged her to engage in regular exercise appropriate for her age and condition.  Lipid screening -     Lipid panel  Routine general medical examination at a health care facility -     CBC with Differential/Platelet -     Comprehensive metabolic panel  Cervical cancer screening -     Cytology - PAP  Normal physical exam findings today, pap sent. She is due for her annual labs for surveillance. We discussed healthy sleep patterns and sleep hygiene. Handouts given on healthy eating and exercise.   Return in 1 year (on 10/21/2023).     Karie Georges, MD

## 2022-10-21 NOTE — Patient Instructions (Signed)

## 2022-10-28 NOTE — Progress Notes (Signed)
Pap is negative, HPV negative, repeat pap in 5 years.

## 2022-11-06 ENCOUNTER — Ambulatory Visit
Admission: RE | Admit: 2022-11-06 | Discharge: 2022-11-06 | Disposition: A | Discharge status: Home / Self Care | Payer: BC Managed Care – PPO | Source: Ambulatory Visit | Attending: Family Medicine | Admitting: Family Medicine

## 2022-11-06 DIAGNOSIS — Z1231 Encounter for screening mammogram for malignant neoplasm of breast: Secondary | ICD-10-CM

## 2023-08-29 ENCOUNTER — Encounter: Payer: Self-pay | Admitting: Obstetrics and Gynecology

## 2023-08-29 ENCOUNTER — Ambulatory Visit: Payer: BC Managed Care – PPO | Admitting: Obstetrics and Gynecology

## 2023-08-29 VITALS — BP 109/73 | HR 70 | Ht 64.8 in | Wt 157.3 lb

## 2023-08-29 DIAGNOSIS — N393 Stress incontinence (female) (male): Secondary | ICD-10-CM

## 2023-08-29 DIAGNOSIS — N952 Postmenopausal atrophic vaginitis: Secondary | ICD-10-CM

## 2023-08-29 DIAGNOSIS — N3281 Overactive bladder: Secondary | ICD-10-CM

## 2023-08-29 LAB — POCT URINALYSIS DIP (CLINITEK)
Bilirubin, UA: NEGATIVE
Glucose, UA: NEGATIVE mg/dL
Ketones, POC UA: NEGATIVE mg/dL
Leukocytes, UA: NEGATIVE
Nitrite, UA: NEGATIVE
POC PROTEIN,UA: NEGATIVE
Spec Grav, UA: 1.02 (ref 1.010–1.025)
Urobilinogen, UA: 0.2 U/dL
pH, UA: 5.5 (ref 5.0–8.0)

## 2023-08-29 MED ORDER — ESTRADIOL 0.1 MG/GM VA CREA: 0.5000 g | TOPICAL_CREAM | VAGINAL | 11 refills | Status: DC

## 2023-08-29 MED ORDER — MIRABEGRON ER 25 MG PO TB24: 25.0000 mg | ORAL_TABLET | Freq: Every day | ORAL | 5 refills | Status: DC

## 2023-08-29 NOTE — Patient Instructions (Addendum)
 Start Myrbetriq 25mg  daily for the OAB

## 2023-08-29 NOTE — Progress Notes (Signed)
 [facility] New Patient Evaluation and Consultation  Referring Provider: Aida House, MD PCP: Aida House, MD Date of Service: 08/29/2023  SUBJECTIVE Chief Complaint: New Patient (Initial Visit) (Patient c/o stress incontinence.)  History of Present Illness: Judy Wilson is a 53 y.o. White or Caucasian female seen in consultation at the request of Dr. Bambi Lever for evaluation of bladder control difficulties.    Review of records significant for: CT Abdomen Pelvis w/ Contrast (01/06/2018)  CLINICAL DATA:  Severe left upper quadrant pain. Recent cholecystectomy.   EXAM: CT ABDOMEN AND PELVIS WITH CONTRAST   TECHNIQUE: Multidetector CT imaging of the abdomen and pelvis was performed using the standard protocol following bolus administration of intravenous contrast.   CONTRAST:  100mL OMNIPAQUE  IOHEXOL  300 MG/ML  SOLN   COMPARISON:  None.   FINDINGS: Lower chest: Atelectasis in the lung bases.   Hepatobiliary: Mild diffuse fatty infiltration of the liver. No focal lesions. Surgical absence of the gallbladder. Small amount of fluid and gas in the gallbladder fossa, likely postoperative. No bile duct dilatation.   Pancreas: Unremarkable. No pancreatic ductal dilatation or surrounding inflammatory changes.   Spleen: Normal in size without focal abnormality.   Adrenals/Urinary Tract: Adrenal glands are unremarkable. Kidneys are normal, without renal calculi, focal lesion, or hydronephrosis. Bladder is unremarkable.   Stomach/Bowel: Stomach is within normal limits. Appendix appears normal. No evidence of bowel wall thickening, distention, or inflammatory changes.   Vascular/Lymphatic: No significant vascular findings are present. No enlarged abdominal or pelvic lymph nodes.   Reproductive: Uterus and bilateral adnexa are unremarkable.   Other: No free air or free fluid. Small periumbilical hernia containing fat.   Musculoskeletal: No acute  or significant osseous findings.   IMPRESSION: 1. No acute process demonstrated in the abdomen or pelvis. No evidence of bowel obstruction or inflammation. Normal appendix. 2. Mild diffuse fatty infiltration of the liver. 3. Surgical absence of the gallbladder with small amount of fluid and gas in the gallbladder fossa, likely postoperative. No bile duct dilatation. 4. Atelectasis in the lung bases.  Urinary Symptoms: Leaks urine with cough/ sneeze, laughing, exercise, lifting, with a full bladder, with movement to the bathroom, and with urgency Leaks 5-6 time(s) per days.  Pad use: 2 pads per day.   Patient is bothered by UI symptoms.  Day time voids 4-5.  Nocturia: 1-2 times per night to void. Voiding dysfunction:  empties bladder well.  Patient does not use a catheter to empty bladder.  When urinating, patient feels dribbling after finishing  UTIs: 0 UTI's in the last year.   Denies history of blood in urine, kidney or bladder stones, pyelonephritis, bladder cancer, and kidney cancer No results found for the last 90 days.   Pelvic Organ Prolapse Symptoms:                  Patient Denies a feeling of a bulge the vaginal area.  Bowel Symptom: Bowel movements: 2-3 time(s) per day Stool consistency: soft  Straining: no.  Splinting: no.  Incomplete evacuation: no.  Patient Denies accidental bowel leakage / fecal incontinence Bowel regimen: none Last colonoscopy: Never done [facility] Colonoscopy          Current Care Gaps     Colonoscopy (Every 10 Years) Never done   No completion history exists for this topic.                 Sexual Function Sexually active: yes.  Sexual orientation: Straight Pain with  sex: No  Pelvic Pain Denies pelvic pain  Past Medical History:  Past Medical History:  Diagnosis Date   Allergies    CIN III (cervical intraepithelial neoplasia III)    Family history of adverse reaction to anesthesia    materal grandmother had trouble  waking her up during bladder suspension   History of chickenpox    History of UTI    HPV in female    Incontinence of urine    Migraines    No pertinent past medical history    Positive PPD      Past Surgical History:   Past Surgical History:  Procedure Laterality Date   CATARACT EXTRACTION Right    CESAREAN SECTION     CHOLECYSTECTOMY N/A 12/23/2017   Procedure: LAPAROSCOPIC CHOLECYSTECTOMY;  Surgeon: Shela Derby, MD;  Location: [facility] OR;  Service: General;  Laterality: N/A;   KNEE SURGERY     LEEP     VAGINAL BIRTH AFTER CESAREAN SECTION       Past OB/GYN History: [id number] Menopausal: No, LMP Patient's last menstrual period was 08/04/2023 (exact date). Contraception: None. Last pap smear was 11/24/22.  Any history of abnormal pap smears: yes. [facility] PAP   This patient has no relevant Health Maintenance data.     Medications: Patient has a current medication list which includes the following prescription(s): [START ON 09/01/2023] estradiol and mirabegron er.   Allergies: Patient is allergic to penicillins, sulfa antibiotics, flagyl [metronidazole], and levaquin [levofloxacin].   Social History:  Social History   Tobacco Use   Smoking status: Former    Current packs/day: 0.00    Types: Cigarettes    Quit date: 03/26/2011    Years since quitting: 12.4   Smokeless tobacco: Never  Vaping Use   Vaping status: Former  Substance Use Topics   Alcohol use: Yes    Comment: occassional   Drug use: No    Relationship status: married Patient lives with husband and children.   Patient is employed as a Clinical research associate. Regular exercise: No History of abuse: No  Family History:   Family History  Problem Relation Age of Onset   Asthma Mother    Arthritis Mother    COPD Father    Hypertension Brother    Hyperlipidemia Brother    Heart disease Brother    Heart attack Brother    Heart disease Maternal Grandmother    Anemia Maternal Grandmother    Osteoporosis Maternal Grandmother     Depression Maternal Grandfather    Heart disease Paternal Grandmother      Review of Systems: Review of Systems  Constitutional:  Negative for chills and fever.  Respiratory:  Negative for cough and shortness of breath.   Cardiovascular:  Negative for chest pain and palpitations.  Gastrointestinal:  Negative for abdominal pain, blood in stool, constipation and diarrhea.  Skin:  Negative for rash.  Neurological:  Negative for weakness.  Endo/Heme/Allergies:  Bruises/bleeds easily.       +Hot Flashes  Psychiatric/Behavioral:  Negative for depression and suicidal ideas.      OBJECTIVE Physical Exam: Vitals:   08/29/23 1026  BP: 109/73  Pulse: 70  Weight: 157 lb 4.8 oz (71.4 kg)  Height: 5' 4.8" (1.646 m)    Physical Exam Vitals reviewed. Exam conducted with a chaperone present.  Constitutional:      Appearance: Normal appearance.  Pulmonary:     Effort: Pulmonary effort is normal.  Abdominal:     Palpations: Abdomen is soft.  Skin:  General: Skin is warm and dry.  Neurological:     General: No focal deficit present.     Mental Status: She is alert and oriented to person, place, and time.  Psychiatric:        Mood and Affect: Mood normal.        Behavior: Behavior normal. Behavior is cooperative.        Thought Content: Thought content normal.      GU / Detailed Urogynecologic Evaluation:  Pelvic Exam: Normal external female genitalia; Bartholin's and Skene's glands normal in appearance; urethral meatus normal in appearance, no urethral masses or discharge.   CST: positive   Speculum exam reveals normal vaginal mucosa with atrophy. Cervix normal appearance. Uterus normal single, nontender. Adnexa normal adnexa.    Pelvic floor strength II/V  Pelvic floor musculature: Right levator non-tender, Right obturator non-tender, Left levator non-tender, Left obturator non-tender  POP-Q:   POP-Q  -2.5                                            Aa   -2.5                                            Ba  -9.5                                              C   3                                            Gh  2.5                                            Pb  10.5                                            tvl   -3                                            Ap  -3                                            Bp  -10                                              D      Rectal Exam:  Normal external exam  Post-Void Residual (PVR) by Bladder Scan: In order to  evaluate bladder emptying, we discussed obtaining a postvoid residual and patient agreed to this procedure.  Procedure: The ultrasound unit was placed on the patient's abdomen in the suprapubic region after the patient had voided.      Laboratory Results: Lab Results  Component Value Date   COLORU yellow 08/29/2023   CLARITYU clear 08/29/2023   GLUCOSEUR negative 08/29/2023   BILIRUBINUR negative 08/29/2023   SPECGRAV 1.020 08/29/2023   RBCUR trace-lysed (A) 08/29/2023   PHUR 5.5 08/29/2023   PROTEINUR NEGATIVE 01/05/2018   UROBILINOGEN 0.2 08/29/2023   LEUKOCYTESUR Negative 08/29/2023    Lab Results  Component Value Date   CREATININE 0.83 10/21/2022   CREATININE 0.88 01/05/2018    No results found for: "HGBA1C"  Lab Results  Component Value Date   HGB 14.0 10/21/2022     ASSESSMENT AND PLAN Ms. Canino is a 53 y.o. with:  1. OAB (overactive bladder)   2. SUI (stress urinary incontinence, female)   3. Vaginal atrophy    We discussed the symptoms of overactive bladder (OAB), which include urinary urgency, urinary frequency, nocturia, with or without urge incontinence.  While we do not know the exact etiology of OAB, several treatment options exist. We discussed management including behavioral therapy (decreasing bladder irritants, urge suppression strategies, timed voids, bladder retraining), physical therapy, medication; for refractory cases posterior tibial nerve  stimulation, sacral neuromodulation, and intravesical botulinum toxin injection. For anticholinergic medications, we discussed the potential side effects of anticholinergics including dry eyes, dry mouth, constipation, cognitive impairment and urinary retention. For Beta-3 agonist medication, we discussed the potential side effect of elevated blood pressure which is more likely to occur in individuals with uncontrolled hypertension. For patient's urgency we are going to trial Myrbetriq 25 mg daily. We discussed we can increase the medication to 50mg  daily if she is still having significant urgency.  For patient's SUI we are trialing a pessary first. We discussed options of sling, urethral bulking, and pessary. Patient wanted something she could try today so we placed a #2 pessary ring with support and knob ([id number]). Patient was able to cough, squat, jump, and push without loss of urine. We tied a string to the pessary and patient was able to place the pessary. She is interested in doing urethral bulking and is looking into this information. Will see when we can get her scheduled with one of the surgeons for office procedure.  Patient has vaginal atrophy on exam. She would benefit from estrogen cream. Patient to use a blueberry sized amount into the vagina. She may use this nightly for 2 weeks and then twice weekly after. We discussed using her finger instead of using the applicator. She was also given samples of uberlube to try. She may prefer this to the water based lubricants due to her dryness.   Patient to follow up for urethral bulking and medication follow up    Vaunda Gutterman G Joletta Manner, NP

## 2023-09-30 ENCOUNTER — Encounter: Payer: Self-pay | Admitting: Obstetrics and Gynecology

## 2023-10-01 ENCOUNTER — Encounter: Payer: Self-pay | Admitting: Obstetrics and Gynecology

## 2023-10-01 ENCOUNTER — Ambulatory Visit: Admitting: Obstetrics and Gynecology

## 2023-10-01 VITALS — BP 128/75 | HR 89 | Ht 65.5 in

## 2023-10-01 DIAGNOSIS — N393 Stress incontinence (female) (male): Secondary | ICD-10-CM

## 2023-10-01 DIAGNOSIS — N3281 Overactive bladder: Secondary | ICD-10-CM

## 2023-10-01 NOTE — Progress Notes (Signed)
 [facility]   Subjective:     Chief Complaint:  Chief Complaint  Patient presents with   Follow-up    Shaquanna A Kaczmarczyk is a 53 y.o. female is here for pessary removal.   History of Present Illness: [place] A Nelligan is a 53 y.o. female with stress incontinence who presents for a pessary check. She is using a size #2 incontinence ring with support pessary. The pessary has been working well but she reports that the . She is using vaginal estrogen. She denies vaginal bleeding.  Past Medical History: Patient  has a past medical history of Allergies, CIN III (cervical intraepithelial neoplasia III), Family history of adverse reaction to anesthesia, History of chickenpox, History of UTI, HPV in female, Incontinence of urine, Migraines, No pertinent past medical history, and Positive PPD.   Past Surgical History: She  has a past surgical history that includes Cesarean section; Knee surgery; Cataract extraction (Right); Cholecystectomy (N/A, 12/23/2017); LEEP; and Vaginal birth after Cesarean section.   Medications: She has a current medication list which includes the following prescription(s): estradiol  and mirabegron  er.   Allergies: Patient is allergic to penicillins, sulfa antibiotics, flagyl [metronidazole], and levaquin [levofloxacin].   Social History: Patient  reports that she quit smoking about 12 years ago. Her smoking use included cigarettes. She has never used smokeless tobacco. She reports current alcohol use. She reports that she does not use drugs.      Objective:    Physical Exam: BP 128/75   Pulse 89   Ht 5' 5.5 (1.664 m)   BMI 25.78 kg/m  Gen: No apparent distress, A&O x 3. Detailed Urogynecologic Evaluation:  Pelvic Exam: Normal external female genitalia; Bartholin's and Skene's glands normal in appearance; urethral meatus normal in appearance, no urethral masses or discharge. The pessary was noted to be in place. It was removed and pessary was changed  to a #4 Incontinence Ring pessary. This was comfortable to the patient and fit well. She did not want to leave it in at this time as her pelvic floor is irritated. Patient has also ordered a pessary removal tool to help with removal if needed in the future.   Assessment/Plan:    Assessment: Ms. Korte is a 53 y.o. with stress incontinence here for a pessary check. She is doing well.  Plan: She will remove once a week. She will continue to use lubricant. She will follow-up in 6 months for a pessary check or sooner as needed.

## 2023-10-01 NOTE — Addendum Note (Signed)
 Addended by: Marlinda Miranda G on: 10/01/2023 12:37 PM   Modules accepted: Orders

## 2023-10-22 ENCOUNTER — Encounter: Payer: BC Managed Care – PPO | Admitting: Family Medicine

## 2023-10-30 ENCOUNTER — Encounter: Payer: Self-pay | Admitting: Family Medicine

## 2023-10-30 ENCOUNTER — Ambulatory Visit (INDEPENDENT_AMBULATORY_CARE_PROVIDER_SITE_OTHER): Admitting: Family Medicine

## 2023-10-30 VITALS — BP 102/68 | HR 72 | Temp 98.3°F | Ht 65.5 in | Wt 160.4 lb

## 2023-10-30 DIAGNOSIS — Z13220 Encounter for screening for lipoid disorders: Secondary | ICD-10-CM | POA: Diagnosis not present

## 2023-10-30 DIAGNOSIS — K580 Irritable bowel syndrome with diarrhea: Secondary | ICD-10-CM | POA: Diagnosis not present

## 2023-10-30 DIAGNOSIS — Z0000 Encounter for general adult medical examination without abnormal findings: Secondary | ICD-10-CM | POA: Diagnosis not present

## 2023-10-30 DIAGNOSIS — Z1211 Encounter for screening for malignant neoplasm of colon: Secondary | ICD-10-CM

## 2023-10-30 LAB — COMPREHENSIVE METABOLIC PANEL WITH GFR
ALT: 28 U/L (ref 0–35)
AST: 25 U/L (ref 0–37)
Albumin: 4.7 g/dL (ref 3.5–5.2)
Alkaline Phosphatase: 78 U/L (ref 39–117)
BUN: 15 mg/dL (ref 6–23)
CO2: 29 meq/L (ref 19–32)
Calcium: 9.7 mg/dL (ref 8.4–10.5)
Chloride: 102 meq/L (ref 96–112)
Creatinine, Ser: 0.79 mg/dL (ref 0.40–1.20)
GFR: 85.9 mL/min (ref >60.00)
Glucose, Bld: 88 mg/dL (ref 70–99)
Potassium: 4.5 meq/L (ref 3.5–5.1)
Sodium: 140 meq/L (ref 135–145)
Total Bilirubin: 0.5 mg/dL (ref 0.2–1.2)
Total Protein: 8.1 g/dL (ref 6.0–8.3)

## 2023-10-30 LAB — CBC WITH DIFFERENTIAL/PLATELET
Basophils Absolute: 0 K/uL (ref 0.0–0.1)
Basophils Relative: 0.7 % (ref 0.0–3.0)
Eosinophils Absolute: 0.1 K/uL (ref 0.0–0.7)
Eosinophils Relative: 2.2 % (ref 0.0–5.0)
HCT: 42.2 % (ref 36.0–46.0)
Hemoglobin: 14.2 g/dL (ref 12.0–15.0)
Lymphocytes Relative: 23.5 % (ref 12.0–46.0)
Lymphs Abs: 1.4 K/uL (ref 0.7–4.0)
MCHC: 33.8 g/dL (ref 30.0–36.0)
MCV: 90.2 fl (ref 78.0–100.0)
Monocytes Absolute: 0.5 K/uL (ref 0.1–1.0)
Monocytes Relative: 9 % (ref 3.0–12.0)
Neutro Abs: 3.8 K/uL (ref 1.4–7.7)
Neutrophils Relative %: 64.6 % (ref 43.0–77.0)
Platelets: 298 K/uL (ref 150.0–400.0)
RBC: 4.68 Mil/uL (ref 3.87–5.11)
RDW: 13.9 % (ref 11.5–15.5)
WBC: 5.9 K/uL (ref 4.0–10.5)

## 2023-10-30 LAB — LIPID PANEL
Cholesterol: 170 mg/dL (ref 0–200)
HDL: 52.2 mg/dL (ref >39.00)
LDL Cholesterol: 100 mg/dL — ABNORMAL HIGH (ref 0–99)
NonHDL: 118.13
Total CHOL/HDL Ratio: 3
Triglycerides: 90 mg/dL (ref 0.0–149.0)
VLDL: 18 mg/dL (ref 0.0–40.0)

## 2023-10-30 MED ORDER — HYOSCYAMINE SULFATE 0.125 MG PO TBDP: 0.1250 mg | ORAL_TABLET | Freq: Four times a day (QID) | ORAL | 3 refills | Status: AC | PRN

## 2023-10-30 NOTE — Patient Instructions (Addendum)
 Shingles vaccination COVID booster   Health Maintenance, Female Adopting a healthy lifestyle and getting preventive care are important in promoting health and wellness. Ask your health care provider about: The right schedule for you to have regular tests and exams. Things you can do on your own to prevent diseases and keep yourself healthy. What should I know about diet, weight, and exercise? Eat a healthy diet  Eat a diet that includes plenty of vegetables, fruits, low-fat dairy products, and lean protein. Do not eat a lot of foods that are high in solid fats, added sugars, or sodium. Maintain a healthy weight Body mass index (BMI) is used to identify weight problems. It estimates body fat based on height and weight. Your health care provider can help determine your BMI and help you achieve or maintain a healthy weight. Get regular exercise Get regular exercise. This is one of the most important things you can do for your health. Most adults should: Exercise for at least 150 minutes each week. The exercise should increase your heart rate and make you sweat (moderate-intensity exercise). Do strengthening exercises at least twice a week. This is in addition to the moderate-intensity exercise. Spend less time sitting. Even light physical activity can be beneficial. Watch cholesterol and blood lipids Have your blood tested for lipids and cholesterol at 52 years of age, then have this test every 5 years. Have your cholesterol levels checked more often if: Your lipid or cholesterol levels are high. You are older than 52 years of age. You are at high risk for heart disease. What should I know about cancer screening? Depending on your health history and family history, you may need to have cancer screening at various ages. This may include screening for: Breast cancer. Cervical cancer. Colorectal cancer. Skin cancer. Lung cancer. What should I know about heart disease, diabetes, and high  blood pressure? Blood pressure and heart disease High blood pressure causes heart disease and increases the risk of stroke. This is more likely to develop in people who have high blood pressure readings or are overweight. Have your blood pressure checked: Every 3-5 years if you are 37-30 years of age. Every year if you are 5 years old or older. Diabetes Have regular diabetes screenings. This checks your fasting blood sugar level. Have the screening done: Once every three years after age 20 if you are at a normal weight and have a low risk for diabetes. More often and at a younger age if you are overweight or have a high risk for diabetes. What should I know about preventing infection? Hepatitis B If you have a higher risk for hepatitis B, you should be screened for this virus. Talk with your health care provider to find out if you are at risk for hepatitis B infection. Hepatitis C Testing is recommended for: Everyone born from [birth year] through 1964. Anyone with known risk factors for hepatitis C. Sexually transmitted infections (STIs) Get screened for STIs, including gonorrhea and chlamydia, if: You are sexually active and are younger than 52 years of age. You are older than 52 years of age and your health care provider tells you that you are at risk for this type of infection. Your sexual activity has changed since you were last screened, and you are at increased risk for chlamydia or gonorrhea. Ask your health care provider if you are at risk. Ask your health care provider about whether you are at high risk for HIV. Your health care provider may recommend a  prescription medicine to help prevent HIV infection. If you choose to take medicine to prevent HIV, you should first get tested for HIV. You should then be tested every 3 months for as long as you are taking the medicine. Pregnancy If you are about to stop having your period (premenopausal) and you may become pregnant, seek counseling  before you get pregnant. Take 400 to 800 micrograms (mcg) of folic acid every day if you become pregnant. Ask for birth control (contraception) if you want to prevent pregnancy. Osteoporosis and menopause Osteoporosis is a disease in which the bones lose minerals and strength with aging. This can result in bone fractures. If you are 100 years old or older, or if you are at risk for osteoporosis and fractures, ask your health care provider if you should: Be screened for bone loss. Take a calcium or vitamin D supplement to lower your risk of fractures. Be given hormone replacement therapy (HRT) to treat symptoms of menopause. Follow these instructions at home: Alcohol use Do not drink alcohol if: Your health care provider tells you not to drink. You are pregnant, may be pregnant, or are planning to become pregnant. If you drink alcohol: Limit how much you have to: 0-1 drink a day. Know how much alcohol is in your drink. In the [place] one drink equals one 12 oz bottle of beer (355 mL), one 5 oz glass of wine (148 mL), or one 1 oz glass of hard liquor (44 mL). Lifestyle Do not use any products that contain nicotine or tobacco. These products include cigarettes, chewing tobacco, and vaping devices, such as e-cigarettes. If you need help quitting, ask your health care provider. Do not use street drugs. Do not share needles. Ask your health care provider for help if you need support or information about quitting drugs. General instructions Schedule regular health, dental, and eye exams. Stay current with your vaccines. Tell your health care provider if: You often feel depressed. You have ever been abused or do not feel safe at home. Summary Adopting a healthy lifestyle and getting preventive care are important in promoting health and wellness. Follow your health care provider's instructions about healthy diet, exercising, and getting tested or screened for diseases. Follow your health care  provider's instructions on monitoring your cholesterol and blood pressure. This information is not intended to replace advice given to you by your health care provider. Make sure you discuss any questions you have with your health care provider. Document Revised: 07/31/2020 Document Reviewed: 07/31/2020 Elsevier Patient Education  2024 [employer].

## 2023-10-30 NOTE — Progress Notes (Signed)
 Complete physical exam  Patient: Judy Wilson   DOB: 1970-09-06   53 y.o. Female  MRN: [id number]  Subjective:    Chief Complaint  Patient presents with   Annual Exam    Judy Wilson is a 53 y.o. female who presents today for a complete physical exam. She reports consuming a general diet, engages in intermittent fasting, eats fruits, veggies and good sources of protein. Gym/ health club routine includes cardio and light weights. She generally feels well. She reports sleeping fairly well. She does not have additional problems to discuss today.   Patient is in perimenopause, she reports skipping periods, hot flashes, etc. Waking up in the middle of the night due to the hot flashes.   Not on any vitamins or supplements.  Most recent fall risk assessment:     No data to display           Most recent depression screenings:    10/21/2022    1:40 PM 10/01/2022   10:34 AM  PHQ 2/9 Scores  PHQ - 2 Score 0 0    Vision:Within last year and [facility] Dental: No current dental problems and Receives regular dental care  Patient Active Problem List   Diagnosis Date Noted   Chronic left hip pain 10/01/2022   Stress incontinence 10/01/2022   SVT (supraventricular tachycardia) (HCC) 10/12/2013     Patient Care Team: Ozell Heron HERO, MD as PCP - General (Family Medicine)   Outpatient Medications Prior to Visit  Medication Sig   [DISCONTINUED] estradiol  (ESTRACE ) 0.1 MG/GM vaginal cream Place 0.5 g vaginally 2 (two) times a week. Place 0.5g nightly for two weeks then twice a week after   [DISCONTINUED] mirabegron  ER (MYRBETRIQ ) 25 MG TB24 tablet Take 1 tablet (25 mg total) by mouth daily.   No facility-administered medications prior to visit.    Review of Systems  HENT:  Negative for hearing loss.   Eyes:  Negative for blurred vision.  Respiratory:  Negative for shortness of breath.   Cardiovascular:  Negative for chest pain.  Gastrointestinal:  Positive for  diarrhea (chronic, IBS).  Genitourinary: Negative.   Musculoskeletal:  Negative for back pain.  Neurological:  Negative for headaches.  Psychiatric/Behavioral:  Negative for depression.        Objective:     BP 102/68   Pulse 72   Temp 98.3 F (36.8 C) (Oral)   Ht 5' 5.5 (1.664 m)   Wt 160 lb 6.4 oz (72.8 kg)   LMP 10/21/2023 (Exact Date)   SpO2 98%   BMI 26.29 kg/m    Physical Exam Vitals reviewed.  Constitutional:      Appearance: Normal appearance. She is well-groomed and normal weight.  HENT:     Right Ear: Tympanic membrane and ear canal normal.     Left Ear: Tympanic membrane and ear canal normal.     Mouth/Throat:     Mouth: Mucous membranes are moist.     Pharynx: No posterior oropharyngeal erythema.  Eyes:     Conjunctiva/sclera: Conjunctivae normal.  Neck:     Thyroid: No thyromegaly.  Cardiovascular:     Rate and Rhythm: Normal rate and regular rhythm.     Pulses: Normal pulses.     Heart sounds: S1 normal and S2 normal.  Pulmonary:     Effort: Pulmonary effort is normal.     Breath sounds: Normal breath sounds and air entry.  Abdominal:     General: Abdomen is  flat. Bowel sounds are normal.     Palpations: Abdomen is soft.  Musculoskeletal:     Right lower leg: No edema.     Left lower leg: No edema.  Lymphadenopathy:     Cervical: No cervical adenopathy.  Neurological:     Mental Status: She is alert and oriented to person, place, and time. Mental status is at baseline.     Gait: Gait is intact.  Psychiatric:        Mood and Affect: Mood and affect normal.        Speech: Speech normal.        Behavior: Behavior normal.        Judgment: Judgment normal.      No results found for any visits on 10/30/23.     Assessment & Plan:    Routine Health Maintenance and Physical Exam  Immunization History  Administered Date(s) Administered   PFIZER(Purple Top)SARS-COV-2 Vaccination 06/16/2019, 07/17/2019, 03/06/2020, 01/17/2021   Tdap  09/30/2017    Health Maintenance  Topic Date Due   Hepatitis B Vaccines (1 of 3 - 19+ 3-dose series) Never done   Colonoscopy  Never done   Pneumococcal Vaccine: 50+ Years (1 of 1 - PCV) Never done   Zoster Vaccines- Shingrix (1 of 2) Never done   COVID-19 Vaccine (5 - 2024-25 season) 11/24/2022   INFLUENZA VACCINE  10/24/2023   Hepatitis C Screening  10/29/2024 (Originally 03/29/1988)   HIV Screening  10/29/2024 (Originally 03/29/1986)   MAMMOGRAM  11/05/2024   DTaP/Tdap/Td (2 - Td or Tdap) 10/01/2027   Cervical Cancer Screening (HPV/Pap Cotest)  10/21/2027   HPV VACCINES  Aged Out   Meningococcal B Vaccine  Aged Out    Discussed health benefits of physical activity, and encouraged her to engage in regular exercise appropriate for her age and condition.  Lipid screening -     Lipid panel; Future  Routine general medical examination at a health care facility -     CBC with Differential/Platelet; Future -     Comprehensive metabolic panel with GFR; Future  Colon cancer screening -     Cologuard  Irritable bowel syndrome with diarrhea -     Hyoscyamine  Sulfate; Place 1 tablet (0.125 mg total) under the tongue every 6 (six) hours as needed.  Dispense: 60 tablet; Refill: 3  Normal physical exam findings. Patient did report chronic diarrhea from IBS on her ROS. I counseled the patient on the recommended amount of exercise per CDC recommendation. I reviewed preventative screening, immunizations, and medical history and updated in the chart, and appropriate labs and vaccinations were ordered. Handouts given on healthy eating and exercise.    Return in 1 year (on 10/29/2024).     Heron Judy Sharper, MD

## 2023-11-02 ENCOUNTER — Ambulatory Visit: Payer: Self-pay | Admitting: Family Medicine

## 2023-12-08 ENCOUNTER — Other Ambulatory Visit: Payer: Self-pay | Admitting: Family Medicine

## 2023-12-08 DIAGNOSIS — Z1231 Encounter for screening mammogram for malignant neoplasm of breast: Secondary | ICD-10-CM

## 2023-12-10 ENCOUNTER — Ambulatory Visit
Admission: RE | Admit: 2023-12-10 | Discharge: 2023-12-10 | Disposition: A | Discharge status: Home / Self Care | Source: Ambulatory Visit

## 2023-12-10 DIAGNOSIS — Z1231 Encounter for screening mammogram for malignant neoplasm of breast: Secondary | ICD-10-CM | POA: Diagnosis not present

## 2023-12-12 ENCOUNTER — Ambulatory Visit: Payer: Self-pay | Admitting: Family Medicine

## 2024-04-02 ENCOUNTER — Ambulatory Visit: Admitting: Obstetrics and Gynecology
